# Patient Record
Sex: Female | Born: 1950
Health system: Southern US, Community
[De-identification: ages and names within clinical notes are randomized; demographics above are authoritative.]

## PROBLEM LIST (undated history)

## (undated) DIAGNOSIS — K449 Diaphragmatic hernia without obstruction or gangrene: Secondary | ICD-10-CM

## (undated) DIAGNOSIS — K2970 Gastritis, unspecified, without bleeding: Secondary | ICD-10-CM

## (undated) DIAGNOSIS — B9681 Helicobacter pylori [H. pylori] as the cause of diseases classified elsewhere: Secondary | ICD-10-CM

## (undated) DIAGNOSIS — M8580 Other specified disorders of bone density and structure, unspecified site: Secondary | ICD-10-CM

## (undated) DIAGNOSIS — B019 Varicella without complication: Secondary | ICD-10-CM

## (undated) DIAGNOSIS — K2270 Barrett's esophagus without dysplasia: Secondary | ICD-10-CM

## (undated) DIAGNOSIS — Z87442 Personal history of urinary calculi: Secondary | ICD-10-CM

## (undated) DIAGNOSIS — Z8669 Personal history of other diseases of the nervous system and sense organs: Secondary | ICD-10-CM

## (undated) DIAGNOSIS — M5416 Radiculopathy, lumbar region: Secondary | ICD-10-CM

## (undated) DIAGNOSIS — K219 Gastro-esophageal reflux disease without esophagitis: Secondary | ICD-10-CM

## (undated) DIAGNOSIS — F5101 Primary insomnia: Secondary | ICD-10-CM

## (undated) DIAGNOSIS — G43909 Migraine, unspecified, not intractable, without status migrainosus: Secondary | ICD-10-CM

## (undated) DIAGNOSIS — N3281 Overactive bladder: Secondary | ICD-10-CM

## (undated) DIAGNOSIS — N189 Chronic kidney disease, unspecified: Secondary | ICD-10-CM

## (undated) HISTORY — PX: APPENDECTOMY: SHX54

## (undated) HISTORY — PX: ABDOMINAL HYSTERECTOMY: SHX81

## (undated) HISTORY — PX: EYE SURGERY: SHX253

## (undated) HISTORY — PX: HERNIA REPAIR: SHX51

## (undated) HISTORY — PX: WISDOM TOOTH EXTRACTION: SHX21

## (undated) HISTORY — PX: TUBAL LIGATION: SHX77

## (undated) HISTORY — DX: Other specified disorders of bone density and structure, unspecified site: M85.80

## (undated) HISTORY — DX: Chronic kidney disease, unspecified: N18.9

## (undated) HISTORY — DX: Migraine, unspecified, not intractable, without status migrainosus: G43.909

## (undated) HISTORY — PX: COLONOSCOPY: SHX174

## (undated) HISTORY — PX: OTHER SURGICAL HISTORY: SHX169

## (undated) HISTORY — PX: TONSILLECTOMY: SUR1361

---

## 1993-11-16 HISTORY — PX: BILATERAL SALPINGOOPHORECTOMY: SHX1223

## 2004-07-13 ENCOUNTER — Ambulatory Visit: Payer: Self-pay | Admitting: General Surgery

## 2004-10-04 ENCOUNTER — Ambulatory Visit: Payer: Self-pay | Admitting: Unknown Physician Specialty

## 2004-12-30 ENCOUNTER — Ambulatory Visit: Payer: Self-pay | Admitting: Family Medicine

## 2005-02-20 ENCOUNTER — Ambulatory Visit: Payer: Self-pay | Admitting: Unknown Physician Specialty

## 2005-05-22 ENCOUNTER — Ambulatory Visit: Payer: Self-pay | Admitting: Gastroenterology

## 2005-10-05 ENCOUNTER — Ambulatory Visit: Payer: Self-pay | Admitting: Unknown Physician Specialty

## 2006-11-15 ENCOUNTER — Ambulatory Visit: Payer: Self-pay | Admitting: Unknown Physician Specialty

## 2006-11-16 ENCOUNTER — Ambulatory Visit: Payer: Self-pay | Admitting: Unknown Physician Specialty

## 2007-11-18 ENCOUNTER — Ambulatory Visit: Payer: Self-pay | Admitting: Unknown Physician Specialty

## 2008-01-23 ENCOUNTER — Ambulatory Visit: Payer: Self-pay | Admitting: Gastroenterology

## 2008-05-12 ENCOUNTER — Ambulatory Visit: Payer: Self-pay | Admitting: Unknown Physician Specialty

## 2008-11-24 ENCOUNTER — Ambulatory Visit: Payer: Self-pay

## 2009-09-01 ENCOUNTER — Ambulatory Visit: Payer: Self-pay | Admitting: Unknown Physician Specialty

## 2011-07-11 ENCOUNTER — Emergency Department: Payer: Self-pay | Admitting: *Deleted

## 2011-07-11 LAB — URINALYSIS, COMPLETE
Bacteria: NONE SEEN
Glucose,UR: NEGATIVE mg/dL (ref 0–75)
Ketone: NEGATIVE
Squamous Epithelial: <1
WBC UR: 2 /HPF (ref 0–5)

## 2011-07-11 LAB — CBC
HCT: 42.1 % (ref 35.0–47.0)
HGB: 13.5 g/dL (ref 12.0–16.0)
MCHC: 32.1 g/dL (ref 32.0–36.0)
MCV: 91 fL (ref 80–100)
RDW: 13.3 % (ref 11.5–14.5)
WBC: 10 10*3/uL (ref 3.6–11.0)

## 2011-07-11 LAB — COMPREHENSIVE METABOLIC PANEL
Albumin: 3.9 g/dL (ref 3.4–5.0)
Alkaline Phosphatase: 116 U/L (ref 50–136)
Anion Gap: 10 (ref 7–16)
BUN: 15 mg/dL (ref 7–18)
Bilirubin,Total: 0.3 mg/dL (ref 0.2–1.0)
Chloride: 105 mmol/L (ref 98–107)
Co2: 23 mmol/L (ref 21–32)
Creatinine: 0.88 mg/dL (ref 0.60–1.30)
EGFR (African American): >60
EGFR (Non-African Amer.): >60
Glucose: 116 mg/dL — ABNORMAL HIGH (ref 65–99)
Osmolality: 277 (ref 275–301)
SGPT (ALT): 27 U/L

## 2011-07-11 LAB — LIPASE, BLOOD: Lipase: 166 U/L (ref 73–393)

## 2012-03-05 ENCOUNTER — Ambulatory Visit: Payer: Self-pay | Admitting: Unknown Physician Specialty

## 2014-01-02 ENCOUNTER — Ambulatory Visit: Payer: Self-pay | Admitting: Gastroenterology

## 2014-01-14 ENCOUNTER — Ambulatory Visit: Payer: Self-pay | Admitting: Urgent Care

## 2014-01-14 LAB — BASIC METABOLIC PANEL
Anion Gap: 9 (ref 7–16)
BUN: 13 mg/dL (ref 7–18)
CALCIUM: 8.5 mg/dL (ref 8.5–10.1)
CHLORIDE: 106 mmol/L (ref 98–107)
CREATININE: 0.8 mg/dL (ref 0.60–1.30)
Co2: 25 mmol/L (ref 21–32)
EGFR (African American): >60
EGFR (Non-African Amer.): >60
Glucose: 133 mg/dL — ABNORMAL HIGH (ref 65–99)
Osmolality: 281 (ref 275–301)
POTASSIUM: 3.3 mmol/L — AB (ref 3.5–5.1)
Sodium: 140 mmol/L (ref 136–145)

## 2014-01-14 LAB — HEPATIC FUNCTION PANEL A (ARMC)
ALBUMIN: 3.3 g/dL — AB (ref 3.4–5.0)
ALK PHOS: 98 U/L
AST: 22 U/L (ref 15–37)
Bilirubin,Total: 0.4 mg/dL (ref 0.2–1.0)
SGPT (ALT): 24 U/L
Total Protein: 7.4 g/dL (ref 6.4–8.2)

## 2014-01-14 LAB — CBC WITH DIFFERENTIAL/PLATELET
Basophil #: 0 10*3/uL (ref 0.0–0.1)
Basophil %: 0.6 %
EOS ABS: 0.4 10*3/uL (ref 0.0–0.7)
EOS PCT: 4.7 %
HCT: 42.3 % (ref 35.0–47.0)
HGB: 14 g/dL (ref 12.0–16.0)
LYMPHS PCT: 24.9 %
Lymphocyte #: 2 10*3/uL (ref 1.0–3.6)
MCH: 29.8 pg (ref 26.0–34.0)
MCHC: 33.1 g/dL (ref 32.0–36.0)
MCV: 90 fL (ref 80–100)
MONO ABS: 0.9 x10 3/mm (ref 0.2–0.9)
Monocyte %: 11.3 %
NEUTROS PCT: 58.5 %
Neutrophil #: 4.7 10*3/uL (ref 1.4–6.5)
Platelet: 229 10*3/uL (ref 150–440)
RBC: 4.7 10*6/uL (ref 3.80–5.20)
RDW: 12.6 % (ref 11.5–14.5)
WBC: 8.1 10*3/uL (ref 3.6–11.0)

## 2014-01-14 LAB — LIPASE, BLOOD: Lipase: 123 U/L (ref 73–393)

## 2014-01-15 ENCOUNTER — Ambulatory Visit: Payer: Self-pay | Admitting: Urgent Care

## 2014-01-16 HISTORY — PX: CHOLECYSTECTOMY: SHX55

## 2014-01-23 ENCOUNTER — Ambulatory Visit: Payer: Self-pay | Admitting: Urgent Care

## 2014-05-14 ENCOUNTER — Ambulatory Visit
Admit: 2014-05-14 | Disposition: A | Discharge status: Home / Self Care | Payer: Self-pay | Attending: Surgery | Admitting: Surgery

## 2014-05-15 LAB — SURGICAL PATHOLOGY

## 2014-05-17 NOTE — Op Note (Addendum)
PATIENT NAME:  Nicole NeighborsWOODY, Shanina S MR#:  [id number] DATE OF BIRTH:  [birth year]  DATE OF PROCEDURE:  05/14/2014  PREOPERATIVE DIAGNOSIS: Biliary dyskinesia.   POSTOPERATIVE DIAGNOSIS:   Biliary dyskinesia.  OPERATION: Robot-assisted laparoscopic cholecystectomy.   SURGEON: Quentin Orealph L. Ely, MD  ANESTHESIA: General.   OPERATIVE PROCEDURE: With the patient in the supine position after the induction of appropriate general anesthesia, the patient's abdomen was prepped with ChloraPrep and draped with sterile towels. The patient was placed in the head down, feet up position. A small infraumbilical incision was made and extended above the umbilicus in an attempt to place the single site port. The fascia was identified, peritoneum was opened. However, there were multiple adhesions in the area.  The single site device was placed without difficulty but there were enough adhesions that I did not feel confident that I could visualize the gallbladder.  For that reason, we put 2 lateral ports in; robotic 8.5 mm ports, and then a 5 mm right upper quadrant port. We used the single site segment for the camera port only.  The patient was then placed in head up, feet down position, rotated slightly to the left side. Robot was brought to the table, docked to the instruments, instruments inserted under direct vision, directed to the bed of the gallbladder. I then moved to the console. The gallbladder was retracted superiorly and laterally, exposing  hepatoduodenal ligament. The cystic artery and cystic duct were identified. The common duct was identified. Dissection was carried along the hepatoduodenal ligament. The cystic duct was doubly clipped and divided. The cystic artery was doubly clipped and divided. The gallbladder was then dissected free of its bed and delivered using the hook  and  cautery apparatus. Once the gallbladder was free, the area was copiously suctioned and irrigated with normal saline. The robot was then  undocked, the instruments removed without difficulty, and the gallbladder was then removed through the single site incision. The area was examined there to be sure there were no evidence of any bowel injury. None was identified. The midline fascia was closed with figure-of-eight sutures of 0 Maxon. The skin was closed with 5-0 nylon. The area was infiltrated with 0.25% Marcaine for postoperative pain control. Sterile dressings were applied. The patient  returned to the recovery room, having tolerated the procedure well. Sponge, instrument and needle counts were correct x2 in the operating room.     ____________________________ Quentin Orealph L. Ely III, MD rle:tr D: 05/14/2014 09:22:26 ET T: 05/14/2014 13:50:52 ET JOB#: [id number]  cc: Carmie Endalph L. Ely III, MD, <Dictator> Rona Ravensobert W. Van Dalen, MD Quentin OreALPH L ELY MD ELECTRONICALLY SIGNED 06/01/2014 14:29

## 2014-07-09 ENCOUNTER — Ambulatory Visit (INDEPENDENT_AMBULATORY_CARE_PROVIDER_SITE_OTHER): Payer: Self-pay | Admitting: Surgery

## 2014-07-09 ENCOUNTER — Encounter: Payer: Self-pay | Admitting: Surgery

## 2014-07-09 VITALS — BP 135/67 | HR 67 | Temp 97.9°F | Resp 20 | Ht 61.0 in | Wt 135.0 lb

## 2014-07-09 DIAGNOSIS — K8020 Calculus of gallbladder without cholecystitis without obstruction: Secondary | ICD-10-CM

## 2014-07-09 NOTE — Progress Notes (Signed)
Outpatient Surgical Follow Up  07/09/2014  Nicole Velez is an 64 y.o. female.   Chief Complaint  Patient presents with  . Follow-up    HPI: She returns following her robotic laparoscopic cholecystectomy. She is doing well postoperatively. She has some mild fatigue issues and some intermittent back pain but has no significant problems overall. She is eating well with no dietary complaints. Her bowel function appears to be near normal.  Past Medical History  Diagnosis Date  . Chronic kidney disease   . Migraines     Past Surgical History  Procedure Laterality Date  . Abdominal hysterectomy    . Appendectomy    . Hernia repair    . Tonsillectomy      Family History  Problem Relation Age of Onset  . Hypertension Father     Social History:  reports that she has never smoked. She has never used smokeless tobacco. She reports that she drinks alcohol. She reports that she does not use illicit drugs.  Allergies: No Known Allergies  Medications reviewed.    ROS unchanged from hospitalization.    BP 135/67 mmHg  Pulse 67  Temp(Src) 97.9 F (36.6 C) (Oral)  Resp 20  Ht 5\' 1"  (1.549 m)  Wt 135 lb (61.236 kg)  BMI 25.52 kg/m2  Physical Exam her wounds look good. There is no evidence for infection. Sutures had been removed previously. Her lungs are clear and her abdomen otherwise benign.     No results found for this or any previous visit (from the past 48 hour(s)). No results found.  Assessment/Plan:  1. Calculus of gallbladder without cholecystitis without obstruction She is recovering uneventfully. I'm very pleased with her progress. We will plan to see her back in our office as necessary. We encouraged her to return to normal activity.     Tiney Rouge III  07/09/2014,negative

## 2014-09-07 ENCOUNTER — Telehealth: Payer: Self-pay | Admitting: Surgery

## 2014-09-07 NOTE — Telephone Encounter (Signed)
Feeling nausea when eating, not vomiting, feels like she can pass out at times

## 2014-09-07 NOTE — Telephone Encounter (Signed)
Returned patient call. Patient complaining of nausea and tiredness. Patient had improved after surgery on 05/14/14 and was tolerating diet well. Directed patient to go she her PCP first to determine the reason for her nausea. Patient confirmed understanding of directions and information.

## 2015-07-29 ENCOUNTER — Other Ambulatory Visit
Admission: RE | Admit: 2015-07-29 | Discharge: 2015-07-29 | Disposition: A | Discharge status: Home / Self Care | Payer: Self-pay | Source: Ambulatory Visit | Attending: Gastroenterology | Admitting: Gastroenterology

## 2015-07-29 DIAGNOSIS — R197 Diarrhea, unspecified: Secondary | ICD-10-CM | POA: Insufficient documentation

## 2015-07-29 LAB — GASTROINTESTINAL PANEL BY PCR, STOOL (REPLACES STOOL CULTURE)
Adenovirus F40/41: NOT DETECTED
Astrovirus: NOT DETECTED
CYCLOSPORA CAYETANENSIS: NOT DETECTED
Campylobacter species: NOT DETECTED
Cryptosporidium: NOT DETECTED
E. COLI O157: NOT DETECTED
ENTAMOEBA HISTOLYTICA: NOT DETECTED
Enteroaggregative E coli (EAEC): NOT DETECTED
Enteropathogenic E coli (EPEC): NOT DETECTED
Enterotoxigenic E coli (ETEC): NOT DETECTED
Giardia lamblia: NOT DETECTED
NOROVIRUS GI/GII: NOT DETECTED
Plesimonas shigelloides: NOT DETECTED
ROTAVIRUS A: NOT DETECTED
SALMONELLA SPECIES: NOT DETECTED
SAPOVIRUS (I, II, IV, AND V): NOT DETECTED
SHIGELLA/ENTEROINVASIVE E COLI (EIEC): NOT DETECTED
Shiga like toxin producing E coli (STEC): NOT DETECTED
VIBRIO CHOLERAE: NOT DETECTED
Vibrio species: NOT DETECTED
Yersinia enterocolitica: NOT DETECTED

## 2015-07-29 LAB — C DIFFICILE QUICK SCREEN W PCR REFLEX
C DIFFICILE (CDIFF) INTERP: NOT DETECTED
C DIFFICILE (CDIFF) TOXIN: NEGATIVE
C DIFFICLE (CDIFF) ANTIGEN: NEGATIVE

## 2015-08-17 ENCOUNTER — Encounter: Payer: Self-pay | Admitting: *Deleted

## 2015-08-18 ENCOUNTER — Ambulatory Visit: Payer: BLUE CROSS/BLUE SHIELD | Admitting: Anesthesiology

## 2015-08-18 ENCOUNTER — Encounter
Admission: RE | Disposition: A | Discharge status: Home / Self Care | Payer: Self-pay | Source: Ambulatory Visit | Attending: Gastroenterology

## 2015-08-18 ENCOUNTER — Ambulatory Visit
Admission: RE | Admit: 2015-08-18 | Discharge: 2015-08-18 | Disposition: A | Discharge status: Home / Self Care | Payer: BLUE CROSS/BLUE SHIELD | Source: Ambulatory Visit | Attending: Gastroenterology | Admitting: Gastroenterology

## 2015-08-18 DIAGNOSIS — R1013 Epigastric pain: Secondary | ICD-10-CM | POA: Diagnosis present

## 2015-08-18 DIAGNOSIS — K317 Polyp of stomach and duodenum: Secondary | ICD-10-CM | POA: Diagnosis not present

## 2015-08-18 DIAGNOSIS — G43909 Migraine, unspecified, not intractable, without status migrainosus: Secondary | ICD-10-CM | POA: Diagnosis not present

## 2015-08-18 DIAGNOSIS — G709 Myoneural disorder, unspecified: Secondary | ICD-10-CM | POA: Diagnosis not present

## 2015-08-18 DIAGNOSIS — K210 Gastro-esophageal reflux disease with esophagitis: Secondary | ICD-10-CM | POA: Diagnosis not present

## 2015-08-18 DIAGNOSIS — Z79899 Other long term (current) drug therapy: Secondary | ICD-10-CM | POA: Diagnosis not present

## 2015-08-18 DIAGNOSIS — K2950 Unspecified chronic gastritis without bleeding: Secondary | ICD-10-CM | POA: Diagnosis not present

## 2015-08-18 DIAGNOSIS — K449 Diaphragmatic hernia without obstruction or gangrene: Secondary | ICD-10-CM | POA: Insufficient documentation

## 2015-08-18 DIAGNOSIS — Z8619 Personal history of other infectious and parasitic diseases: Secondary | ICD-10-CM | POA: Insufficient documentation

## 2015-08-18 HISTORY — DX: Gastro-esophageal reflux disease without esophagitis: K21.9

## 2015-08-18 HISTORY — DX: Varicella without complication: B01.9

## 2015-08-18 HISTORY — DX: Helicobacter pylori (H. pylori) as the cause of diseases classified elsewhere: B96.81

## 2015-08-18 HISTORY — PX: ESOPHAGOGASTRODUODENOSCOPY (EGD) WITH PROPOFOL: SHX5813

## 2015-08-18 HISTORY — DX: Gastritis, unspecified, without bleeding: K29.70

## 2015-08-18 HISTORY — DX: Diaphragmatic hernia without obstruction or gangrene: K44.9

## 2015-08-18 SURGERY — ESOPHAGOGASTRODUODENOSCOPY (EGD) WITH PROPOFOL
Anesthesia: General

## 2015-08-18 MED ORDER — PROPOFOL 500 MG/50ML IV EMUL
INTRAVENOUS | Status: DC | PRN
  Administered 2015-08-18: 120 ug/kg/min via INTRAVENOUS

## 2015-08-18 MED ORDER — PROPOFOL 10 MG/ML IV BOLUS
INTRAVENOUS | Status: DC | PRN
  Administered 2015-08-18: 30 mg via INTRAVENOUS
  Administered 2015-08-18: 70 mg via INTRAVENOUS

## 2015-08-18 MED ORDER — LIDOCAINE 2% (20 MG/ML) 5 ML SYRINGE
INTRAMUSCULAR | Status: DC | PRN
  Administered 2015-08-18: 50 mg via INTRAVENOUS

## 2015-08-18 MED ORDER — GLYCOPYRROLATE 0.2 MG/ML IJ SOLN
INTRAMUSCULAR | Status: DC | PRN
  Administered 2015-08-18: 0.2 mg via INTRAVENOUS

## 2015-08-18 MED ORDER — SODIUM CHLORIDE 0.9 % IV SOLN
INTRAVENOUS | Status: DC
  Administered 2015-08-18: 1000 mL via INTRAVENOUS

## 2015-08-18 MED ORDER — MIDAZOLAM HCL 5 MG/5ML IJ SOLN
INTRAMUSCULAR | Status: DC | PRN
  Administered 2015-08-18: 1 mg via INTRAVENOUS

## 2015-08-18 MED ORDER — SODIUM CHLORIDE 0.9 % IV SOLN: INTRAVENOUS | Status: DC

## 2015-08-18 MED ORDER — LIDOCAINE HCL (PF) 1 % IJ SOLN
INTRAMUSCULAR | Status: AC
  Administered 2015-08-18: 0.5 mL
  Filled 2015-08-18: qty 2

## 2015-08-18 NOTE — H&P (Signed)
Outpatient short stay form Pre-procedure 08/18/2015 4:00 PM Nicole Deem MD  Primary Physician: Dr Marisue Ivan  Reason for visit:  EGD  History of present illness:  Patient is a 66 year old female presenting today with complaint of some epigastric pain and dyspepsia that occurs after eating. This has improved a little bit with a change of NSAID from Protonix to rabeprazole. She has no dysphagia. She has a remote history of Helicobacter pylori. She denies use of any aspirin products or blood thinning agents. She has had a history of the past of cholecystectomy and does have some loose stools since then.    Current Facility-Administered Medications:  .  0.9 %  sodium chloride infusion, , Intravenous, Continuous, Nicole Deem, MD, Last Rate: 20 mL/hr at 08/18/15 1513, 1,000 mL at 08/18/15 1513 .  0.9 %  sodium chloride infusion, , Intravenous, Continuous, Nicole Deem, MD  Facility-Administered Medications Ordered in Other Encounters:  .  glycopyrrolate (ROBINUL) injection, , Intravenous, Anesthesia Intra-op, Mathews Argyle, CRNA, 0.2 mg at 08/18/15 1553 .  midazolam (VERSED) 5 MG/5ML injection, , Intravenous, Anesthesia Intra-op, Mathews Argyle, CRNA, 1 mg at 08/18/15 1553  Prescriptions Prior to Admission  Medication Sig Dispense Refill Last Dose  . PREMARIN 0.625 MG tablet TK 1 T PO QD  3 08/17/2015 at Unknown time  . traMADol (ULTRAM) 50 MG tablet Take by mouth every 6 (six) hours as needed.     . traZODone (DESYREL) 50 MG tablet Take 50 mg by mouth at bedtime.   Past Week at Unknown time  . Calcium Carbonate-Vitamin D 600-400 MG-UNIT per tablet Take by mouth.   Taking  . Multiple Vitamin (MULTI-VITAMINS) TABS Take by mouth.   Taking     No Known Allergies   Past Medical History:  Diagnosis Date  . Chickenpox   . Chronic kidney disease   . GERD (gastroesophageal reflux disease)   . Helicobacter pylori gastritis   . Hiatal hernia   . Migraines   . Migraines      Review of systems:      Physical Exam    Heart and lungs: Regular rate and rhythm without rub or gallop, lungs are bilaterally clear.    HEENT: Normocephalic atraumatic eyes are anicteric    Other:     Pertinant exam for procedure: Soft, mild tenderness to palpation the epigastric region, nondistended bowel sounds positive normoactive. No masses rebound or organomegaly.    Planned proceedures: EGD and indicated procedures. I have discussed the risks benefits and complications of procedures to include not limited to bleeding, infection, perforation and the risk of sedation and the patient wishes to proceed.    Nicole Deem, MD Gastroenterology 08/18/2015  4:00 PM

## 2015-08-18 NOTE — Transfer of Care (Signed)
Immediate Anesthesia Transfer of Care Note  Patient: Nicole Velez  Procedure(s) Performed: Procedure(s): ESOPHAGOGASTRODUODENOSCOPY (EGD) WITH PROPOFOL (N/A)  Patient Location: Endoscopy Unit  Anesthesia Type:General  Level of Consciousness: awake  Airway & Oxygen Therapy: Patient Spontanous Breathing and Patient connected to nasal cannula oxygen  Post-op Assessment: Report given to RN and Post -op Vital signs reviewed and stable  Post vital signs: Reviewed  Last Vitals:  Vitals:   08/18/15 1437 08/18/15 1624  BP: 132/63 (!) 118/55  Pulse: 67 87  Resp: 20 20  Temp: 36.9 C     Last Pain:  Vitals:   08/18/15 1437  TempSrc: Tympanic         Complications: No apparent anesthesia complications

## 2015-08-18 NOTE — Op Note (Signed)
[facility] Gastroenterology Patient Name: Nicole Velez Procedure Date: 08/18/2015 3:53 PM MRN: [id number] Account #: [id number] Date of Birth: March 20, 1950 Admit Type: Outpatient Age: 65 Room: [facility] ENDO ROOM 1 Gender: Female Note Status: Finalized Procedure:            Upper GI endoscopy Indications:          Epigastric abdominal pain, Dyspepsia Providers:            Christena Deem, MD Referring MD:         No Local Md, MD (Referring MD) Medicines:            Monitored Anesthesia Care Complications:        No immediate complications. Procedure:            Pre-Anesthesia Assessment:                       - ASA Grade Assessment: II - A patient with mild                        systemic disease.                       After obtaining informed consent, the endoscope was                        passed under direct vision. Throughout the procedure,                        the patient's blood pressure, pulse, and oxygen                        saturations were monitored continuously. The Endoscope                        was introduced through the mouth, and advanced to the                        third part of duodenum. The upper GI endoscopy was                        accomplished without difficulty. The patient tolerated                        the procedure well. Findings:      The Z-line was variable. Biopsies were taken with a cold forceps for       histology.      The exam of the esophagus was otherwise normal.      Diffuse mild inflammation characterized by congestion (edema) and       erythema was found in the gastric body. Biopsies were taken with a cold       forceps for histology.      A small hiatal hernia was found. The Z-line was a variable distance from       incisors; the hiatal hernia was sliding.      A single 3 mm sessile polyp with no bleeding and no stigmata of recent       bleeding was found in the gastric body. Biopsies were taken with a cold   forceps for histology.      Mild mucosal variance characterized by altered texture was found in  the       entire duodenum. Biopsies were taken with a cold forceps for histology. Impression:           - Z-line variable. Biopsied.                       - Gastritis. Biopsied.                       - Small hiatal hernia.                       - A single gastric polyp. Biopsied.                       - Mucosal variant in the duodenum. Biopsied. Recommendation:       - Continue present medications.                       - Use sucralfate tablets 1 gram PO QID.                       - Return to GI clinic in 1 month. Procedure Code(s):    --- Professional ---                       [phone], Esophagogastroduodenoscopy, flexible, transoral;                        with biopsy, single or multiple Diagnosis Code(s):    --- Professional ---                       K22.8, Other specified diseases of esophagus                       K29.70, Gastritis, unspecified, without bleeding                       K44.9, Diaphragmatic hernia without obstruction or                        gangrene                       K31.7, Polyp of stomach and duodenum                       K31.89, Other diseases of stomach and duodenum                       R10.13, Epigastric pain CPT copyright 2016 [employer] Association. All rights reserved. The codes documented in this report are preliminary and upon coder review may  be revised to meet current compliance requirements. Christena Deem, MD 08/18/2015 4:25:33 PM This report has been signed electronically. Number of Addenda: 0 Note Initiated On: 08/18/2015 3:53 PM      [facility]

## 2015-08-18 NOTE — Anesthesia Postprocedure Evaluation (Signed)
Anesthesia Post Note  Patient: Nicole Velez  Procedure(s) Performed: Procedure(s) (LRB): ESOPHAGOGASTRODUODENOSCOPY (EGD) WITH PROPOFOL (N/A)  Patient location during evaluation: Endoscopy Anesthesia Type: General Level of consciousness: awake and alert Pain management: pain level controlled Vital Signs Assessment: post-procedure vital signs reviewed and stable Respiratory status: spontaneous breathing, nonlabored ventilation, respiratory function stable and patient connected to nasal cannula oxygen Cardiovascular status: blood pressure returned to baseline and stable Postop Assessment: no signs of nausea or vomiting Anesthetic complications: no    Last Vitals:  Vitals:   08/18/15 1644 08/18/15 1700  BP: 136/74   Pulse: 68 64  Resp: 20 17  Temp:      Last Pain:  Vitals:   08/18/15 1624  TempSrc: Tympanic                 Cleda Mccreedy Piscitello

## 2015-08-18 NOTE — Anesthesia Preprocedure Evaluation (Addendum)
Anesthesia Evaluation  Patient identified by MRN, date of birth, ID band Patient awake    Reviewed: Allergy & Precautions, NPO status , Patient's Chart, lab work & pertinent test results  History of Anesthesia Complications Negative for: history of anesthetic complications  Airway Mallampati: III       Dental  (+) Poor Dentition   Pulmonary neg pulmonary ROS,    breath sounds clear to auscultation       Cardiovascular Exercise Tolerance: Good  Rhythm:Regular     Neuro/Psych  Headaches,  Neuromuscular disease    GI/Hepatic hiatal hernia, GERD  Medicated,  Endo/Other    Renal/GU Renal disease     Musculoskeletal   Abdominal Normal abdominal exam  (+)   Peds negative pediatric ROS (+)  Hematology negative hematology ROS (+)   Anesthesia Other Findings Past Medical History: No date: Chickenpox No date: Chronic kidney disease No date: GERD (gastroesophageal reflux disease) No date: Helicobacter pylori gastritis No date: Hiatal hernia No date: Migraines No date: Migraines   Reproductive/Obstetrics                            Anesthesia Physical Anesthesia Plan  ASA: III  Anesthesia Plan: General   Post-op Pain Management:    Induction: Intravenous  Airway Management Planned: Natural Airway and Nasal Cannula  Additional Equipment:   Intra-op Plan:   Post-operative Plan:   Informed Consent: I have reviewed the patients History and Physical, chart, labs and discussed the procedure including the risks, benefits and alternatives for the proposed anesthesia with the patient or authorized representative who has indicated his/her understanding and acceptance.     Plan Discussed with: CRNA  Anesthesia Plan Comments:        Anesthesia Quick Evaluation

## 2015-08-19 ENCOUNTER — Encounter: Payer: Self-pay | Admitting: Gastroenterology

## 2015-08-20 LAB — SURGICAL PATHOLOGY

## 2015-09-14 ENCOUNTER — Other Ambulatory Visit: Payer: Self-pay | Admitting: Obstetrics & Gynecology

## 2015-09-14 DIAGNOSIS — Z1231 Encounter for screening mammogram for malignant neoplasm of breast: Secondary | ICD-10-CM

## 2015-09-29 ENCOUNTER — Ambulatory Visit
Admission: RE | Admit: 2015-09-29 | Discharge: 2015-09-29 | Disposition: A | Discharge status: Home / Self Care | Payer: BLUE CROSS/BLUE SHIELD | Source: Ambulatory Visit | Attending: Obstetrics & Gynecology | Admitting: Obstetrics & Gynecology

## 2015-09-29 DIAGNOSIS — Z1231 Encounter for screening mammogram for malignant neoplasm of breast: Secondary | ICD-10-CM | POA: Diagnosis not present

## 2015-10-04 ENCOUNTER — Other Ambulatory Visit: Payer: Self-pay | Admitting: Orthopedic Surgery

## 2015-10-04 DIAGNOSIS — M5441 Lumbago with sciatica, right side: Secondary | ICD-10-CM

## 2015-10-04 DIAGNOSIS — M5417 Radiculopathy, lumbosacral region: Secondary | ICD-10-CM

## 2016-03-21 ENCOUNTER — Other Ambulatory Visit: Payer: Self-pay | Admitting: Gastroenterology

## 2016-03-21 DIAGNOSIS — R1013 Epigastric pain: Secondary | ICD-10-CM

## 2016-03-29 ENCOUNTER — Ambulatory Visit
Admission: RE | Admit: 2016-03-29 | Discharge: 2016-03-29 | Disposition: A | Discharge status: Home / Self Care | Payer: BLUE CROSS/BLUE SHIELD | Source: Ambulatory Visit | Attending: Gastroenterology | Admitting: Gastroenterology

## 2016-03-29 DIAGNOSIS — R1013 Epigastric pain: Secondary | ICD-10-CM | POA: Insufficient documentation

## 2016-03-29 DIAGNOSIS — K210 Gastro-esophageal reflux disease with esophagitis: Secondary | ICD-10-CM | POA: Diagnosis not present

## 2016-03-29 MED ORDER — TECHNETIUM TC 99M SULFUR COLLOID
2.0000 | Freq: Once | INTRAVENOUS | Status: AC | PRN
  Administered 2016-03-29: 2.76 via INTRAVENOUS

## 2016-07-20 ENCOUNTER — Other Ambulatory Visit: Payer: Self-pay | Admitting: Obstetrics & Gynecology

## 2016-07-20 DIAGNOSIS — Z1231 Encounter for screening mammogram for malignant neoplasm of breast: Secondary | ICD-10-CM

## 2016-10-02 ENCOUNTER — Ambulatory Visit: Payer: BLUE CROSS/BLUE SHIELD

## 2016-10-26 ENCOUNTER — Ambulatory Visit
Admission: RE | Admit: 2016-10-26 | Discharge: 2016-10-26 | Disposition: A | Discharge status: Home / Self Care | Payer: BLUE CROSS/BLUE SHIELD | Source: Ambulatory Visit | Attending: Obstetrics & Gynecology | Admitting: Obstetrics & Gynecology

## 2016-10-26 DIAGNOSIS — Z1231 Encounter for screening mammogram for malignant neoplasm of breast: Secondary | ICD-10-CM | POA: Insufficient documentation

## 2017-06-26 ENCOUNTER — Other Ambulatory Visit: Payer: Self-pay | Admitting: Gastroenterology

## 2017-06-26 DIAGNOSIS — R1084 Generalized abdominal pain: Secondary | ICD-10-CM

## 2017-06-28 ENCOUNTER — Ambulatory Visit
Admission: RE | Admit: 2017-06-28 | Discharge: 2017-06-28 | Disposition: A | Discharge status: Home / Self Care | Payer: Medicare Other | Source: Ambulatory Visit | Attending: Gastroenterology | Admitting: Gastroenterology

## 2017-06-28 DIAGNOSIS — N200 Calculus of kidney: Secondary | ICD-10-CM | POA: Diagnosis not present

## 2017-06-28 DIAGNOSIS — R1084 Generalized abdominal pain: Secondary | ICD-10-CM | POA: Diagnosis present

## 2017-06-28 DIAGNOSIS — N281 Cyst of kidney, acquired: Secondary | ICD-10-CM | POA: Diagnosis not present

## 2017-06-28 DIAGNOSIS — Z9049 Acquired absence of other specified parts of digestive tract: Secondary | ICD-10-CM | POA: Diagnosis not present

## 2017-06-28 DIAGNOSIS — Z9889 Other specified postprocedural states: Secondary | ICD-10-CM | POA: Insufficient documentation

## 2017-06-28 DIAGNOSIS — Z90710 Acquired absence of both cervix and uterus: Secondary | ICD-10-CM | POA: Diagnosis not present

## 2017-06-28 MED ORDER — IOPAMIDOL (ISOVUE-300) INJECTION 61%
85.0000 mL | Freq: Once | INTRAVENOUS | Status: AC | PRN
  Administered 2017-06-28: 85 mL via INTRAVENOUS

## 2017-07-24 ENCOUNTER — Other Ambulatory Visit: Payer: Self-pay | Admitting: Obstetrics & Gynecology

## 2017-07-24 DIAGNOSIS — Z1231 Encounter for screening mammogram for malignant neoplasm of breast: Secondary | ICD-10-CM

## 2017-09-25 ENCOUNTER — Emergency Department
Admission: EM | Admit: 2017-09-25 | Discharge: 2017-09-25 | Disposition: A | Discharge status: Home / Self Care | Payer: Medicare Other | Attending: Emergency Medicine | Admitting: Emergency Medicine

## 2017-09-25 ENCOUNTER — Emergency Department: Payer: Medicare Other

## 2017-09-25 ENCOUNTER — Encounter: Payer: Self-pay | Admitting: Emergency Medicine

## 2017-09-25 ENCOUNTER — Other Ambulatory Visit: Payer: Self-pay

## 2017-09-25 DIAGNOSIS — Z79899 Other long term (current) drug therapy: Secondary | ICD-10-CM | POA: Diagnosis not present

## 2017-09-25 DIAGNOSIS — N200 Calculus of kidney: Secondary | ICD-10-CM | POA: Diagnosis not present

## 2017-09-25 DIAGNOSIS — N189 Chronic kidney disease, unspecified: Secondary | ICD-10-CM | POA: Diagnosis not present

## 2017-09-25 DIAGNOSIS — R1110 Vomiting, unspecified: Secondary | ICD-10-CM | POA: Insufficient documentation

## 2017-09-25 DIAGNOSIS — R1032 Left lower quadrant pain: Secondary | ICD-10-CM | POA: Diagnosis present

## 2017-09-25 LAB — URINALYSIS, COMPLETE (UACMP) WITH MICROSCOPIC
Bacteria, UA: NONE SEEN
Bilirubin Urine: NEGATIVE
GLUCOSE, UA: NEGATIVE mg/dL
Ketones, ur: 20 mg/dL — AB
LEUKOCYTES UA: NEGATIVE
Nitrite: NEGATIVE
Protein, ur: 30 mg/dL — AB
SPECIFIC GRAVITY, URINE: 1.019 (ref 1.005–1.030)
pH: 6 (ref 5.0–8.0)

## 2017-09-25 LAB — BASIC METABOLIC PANEL
Anion gap: 12 (ref 5–15)
BUN: 16 mg/dL (ref 8–23)
CHLORIDE: 103 mmol/L (ref 98–111)
CO2: 21 mmol/L — AB (ref 22–32)
Calcium: 9.2 mg/dL (ref 8.9–10.3)
Creatinine, Ser: 0.83 mg/dL (ref 0.44–1.00)
GFR calc Af Amer: >60 mL/min (ref >60)
GFR calc non Af Amer: >60 mL/min (ref >60)
GLUCOSE: 132 mg/dL — AB (ref 70–99)
POTASSIUM: 3.8 mmol/L (ref 3.5–5.1)
Sodium: 136 mmol/L (ref 135–145)

## 2017-09-25 LAB — CBC
HCT: 42.3 % (ref 35.0–47.0)
Hemoglobin: 14.6 g/dL (ref 12.0–16.0)
MCH: 31.1 pg (ref 26.0–34.0)
MCHC: 34.6 g/dL (ref 32.0–36.0)
MCV: 89.9 fL (ref 80.0–100.0)
Platelets: 231 10*3/uL (ref 150–440)
RBC: 4.7 MIL/uL (ref 3.80–5.20)
RDW: 13.9 % (ref 11.5–14.5)
WBC: 9.6 10*3/uL (ref 3.6–11.0)

## 2017-09-25 LAB — GLUCOSE, CAPILLARY: Glucose-Capillary: 135 mg/dL — ABNORMAL HIGH (ref 70–99)

## 2017-09-25 MED ORDER — NAPROXEN 500 MG PO TABS: 500.0000 mg | ORAL_TABLET | Freq: Two times a day (BID) | ORAL | 2 refills | Status: DC

## 2017-09-25 MED ORDER — ONDANSETRON 4 MG PO TBDP: 4.0000 mg | ORAL_TABLET | Freq: Three times a day (TID) | ORAL | 0 refills | Status: DC | PRN

## 2017-09-25 MED ORDER — FENTANYL CITRATE (PF) 100 MCG/2ML IJ SOLN: 50.0000 ug | INTRAMUSCULAR | Status: DC | PRN

## 2017-09-25 MED ORDER — TAMSULOSIN HCL 0.4 MG PO CAPS: 0.4000 mg | ORAL_CAPSULE | Freq: Every day | ORAL | 0 refills | Status: DC

## 2017-09-25 MED ORDER — MORPHINE SULFATE (PF) 4 MG/ML IV SOLN
4.0000 mg | Freq: Once | INTRAVENOUS | Status: AC
  Administered 2017-09-25: 4 mg via INTRAVENOUS
  Filled 2017-09-25: qty 1

## 2017-09-25 MED ORDER — ONDANSETRON HCL 4 MG/2ML IJ SOLN
4.0000 mg | Freq: Once | INTRAMUSCULAR | Status: AC | PRN
  Administered 2017-09-25: 4 mg via INTRAVENOUS
  Filled 2017-09-25: qty 2

## 2017-09-25 MED ORDER — KETOROLAC TROMETHAMINE 30 MG/ML IJ SOLN
15.0000 mg | Freq: Once | INTRAMUSCULAR | Status: AC
  Administered 2017-09-25: 15 mg via INTRAVENOUS
  Filled 2017-09-25: qty 1

## 2017-09-25 MED ORDER — HYDROCODONE-ACETAMINOPHEN 5-325 MG PO TABS: 1.0000 | ORAL_TABLET | ORAL | 0 refills | Status: DC | PRN

## 2017-09-25 NOTE — ED Provider Notes (Signed)
[facility] [address] Emergency Department Provider Note   ____________________________________________    I have reviewed the triage vital signs and the nursing notes.   HISTORY  Chief Complaint Flank Pain and Emesis     HPI Nicole Velez is a 67 y.o. female who presents with complaints of left-sided flank pain and nausea and vomiting.  Patient reports the pain started this morning, is severe and radiates into her left groin.  She states this feels like kidney stones which she has had in the past.  She denies fevers or chills.  No dysuria.  Does report some difficulty urinating.  She has not taken anything for this.   Past Medical History:  Diagnosis Date  . Chickenpox   . Chronic kidney disease   . GERD (gastroesophageal reflux disease)   . Helicobacter pylori gastritis   . Hiatal hernia   . Migraines   . Osteopenia     There are no active problems to display for this patient.   Past Surgical History:  Procedure Laterality Date  . ABDOMINAL HYSTERECTOMY    . APPENDECTOMY    . BILATERAL SALPINGOOPHORECTOMY  11/1993   [facility]  . CHOLECYSTECTOMY  2016  . COLONOSCOPY    . ESOPHAGOGASTRODUODENOSCOPY (EGD) WITH PROPOFOL N/A 08/18/2015   Procedure: ESOPHAGOGASTRODUODENOSCOPY (EGD) WITH PROPOFOL;  Surgeon: Christena Deem, MD;  Location: [facility] ENDOSCOPY;  Service: Endoscopy;  Laterality: N/A;  . HERNIA REPAIR    . TONSILLECTOMY      Prior to Admission medications   Medication Sig Start Date End Date Taking? Authorizing Provider  Multiple Vitamin (MULTI-VITAMINS) TABS Take 1 tablet by mouth daily.    Yes [provider]  MYRBETRIQ 25 MG TB24 tablet Take 1 tablet by mouth daily. 08/17/17  Yes [provider]  PREMARIN 0.625 MG tablet Take 0.625 mg by mouth daily.  04/26/14  Yes [provider]  RABEprazole (ACIPHEX) 20 MG tablet Take 1 tablet by mouth daily. 09/10/17  Yes [provider]  HYDROcodone-acetaminophen  (NORCO/VICODIN) 5-325 MG tablet Take 1 tablet by mouth every 4 (four) hours as needed for moderate pain. 09/25/17   Jene Every, MD  naproxen (NAPROSYN) 500 MG tablet Take 1 tablet (500 mg total) by mouth 2 (two) times daily with a meal. 09/25/17   Jene Every, MD  ondansetron (ZOFRAN ODT) 4 MG disintegrating tablet Take 1 tablet (4 mg total) by mouth every 8 (eight) hours as needed for nausea or vomiting. 09/25/17   Jene Every, MD  tamsulosin (FLOMAX) 0.4 MG CAPS capsule Take 1 capsule (0.4 mg total) by mouth daily. 09/25/17   Jene Every, MD     Allergies Patient has no known allergies.  Family History  Problem Relation Age of Onset  . Hypertension Father     Social History Social History   Tobacco Use  . Smoking status: Never Smoker  . Smokeless tobacco: Never Used  Substance Use Topics  . Alcohol use: Yes    Alcohol/week: 0.0 standard drinks    Comment: occasional  . Drug use: No    Review of Systems  Constitutional: No fever/chills Eyes: No visual changes.  ENT: No sore throat. Cardiovascular: Denies chest pain. Respiratory: Denies shortness of breath. Gastrointestinal: As above Genitourinary: Negative for dysuria.  As above Musculoskeletal: Negative for back pain. Skin: Negative for rash. Neurological: Negative for headaches or weakness   ____________________________________________   PHYSICAL EXAM:  VITAL SIGNS: ED Triage Vitals  Enc Vitals Group     BP  09/25/17 1050 (!) 162/78     Pulse Rate 09/25/17 1050 71     Resp 09/25/17 1050 20     Temp 09/25/17 1050 (!) 97.5 F (36.4 C)     Temp Source 09/25/17 1050 Oral     SpO2 09/25/17 1050 100 %     Weight 09/25/17 1050 65.3 kg (144 lb)     Height 09/25/17 1050 1.575 m (5\' 2" )     Head Circumference --      Peak Flow --      Pain Score 09/25/17 1057 10     Pain Loc --      Pain Edu? --      Excl. in GC? --     Constitutional: Alert and oriented.  In obvious discomfort  Nose: No  congestion/rhinnorhea. Mouth/Throat: Mucous membranes are moist.   Neck:  Painless ROM Cardiovascular: Normal rate, regular rhythm. Grossly normal heart sounds.  Good peripheral circulation. Respiratory: Normal respiratory effort.  No retractions.  Gastrointestinal: Soft and nontender. No distention.  No CVA tenderness. Genitourinary: deferred Musculoskeletal:Warm and well perfused Neurologic:  Normal speech and language. No gross focal neurologic deficits are appreciated.  Skin:  Skin is warm, dry and intact. No rash noted. Psychiatric: Mood and affect are normal. Speech and behavior are normal.  ____________________________________________   LABS (all labs ordered are listed, but only abnormal results are displayed)  Labs Reviewed  URINALYSIS, COMPLETE (UACMP) WITH MICROSCOPIC - Abnormal; Notable for the following components:      Result Value   Color, Urine YELLOW (*)    APPearance HAZY (*)    Hgb urine dipstick MODERATE (*)    Ketones, ur 20 (*)    Protein, ur 30 (*)    All other components within normal limits  BASIC METABOLIC PANEL - Abnormal; Notable for the following components:   CO2 21 (*)    Glucose, Bld 132 (*)    All other components within normal limits  GLUCOSE, CAPILLARY - Abnormal; Notable for the following components:   Glucose-Capillary 135 (*)    All other components within normal limits  CBC   ____________________________________________  EKG   ____________________________________________  RADIOLOGY  CT scan confirms 3 mm ureterolithiasis ____________________________________________   PROCEDURES  Procedure(s) performed: No  Procedures   Critical Care performed: No ____________________________________________   INITIAL IMPRESSION / ASSESSMENT AND PLAN / ED COURSE  Pertinent labs & imaging results that were available during my care of the patient were reviewed by me and considered in my medical decision making (see chart for  details).  She presents with abrupt onset of left-sided flank pain, quite suspicious for ureterolithiasis.  Will treat with IV Toradol, IV Zofran, IV fluids, obtain labs, urinalysis and CT renal stone study  Little improvement with Toradol we will give IV morphine.  CT confirms kidney stone.  Patient's pain is much better . Urinalysis negative for infection.  Patient's pain remains controlled, appropriate for discharge with outpatient follow-up.  Return precautions discussed    ____________________________________________   FINAL CLINICAL IMPRESSION(S) / ED DIAGNOSES  Final diagnoses:  Kidney stone        Note:  This document was prepared using Dragon voice recognition software and may include unintentional dictation errors.     Jene Every, MD 09/25/17 [phone]

## 2017-09-25 NOTE — ED Notes (Signed)
Pt gone to CT 

## 2017-09-25 NOTE — ED Notes (Addendum)
Pt had brief period of confusion after IV stick, pt's daughter states she does that around getting needles over the past few days.  Will hold on patient receiving Fentanyl until after she has calmed down.

## 2017-09-25 NOTE — ED Notes (Addendum)
Esign not working at this time pt verbalized discharge instructions and has no questions at this time.

## 2017-09-25 NOTE — ED Triage Notes (Signed)
Pt arrived via POV with reports of left flank pain and vomiting that started this morning. Pt has hx of kidney stones, pain feels similar.    Pt had episode of vomiting in triage.

## 2017-11-16 ENCOUNTER — Ambulatory Visit
Admission: RE | Admit: 2017-11-16 | Discharge: 2017-11-16 | Disposition: A | Discharge status: Home / Self Care | Payer: Medicare Other | Source: Ambulatory Visit | Attending: Obstetrics & Gynecology | Admitting: Obstetrics & Gynecology

## 2017-11-16 DIAGNOSIS — Z1231 Encounter for screening mammogram for malignant neoplasm of breast: Secondary | ICD-10-CM

## 2018-05-13 ENCOUNTER — Ambulatory Visit: Admission: RE | Admit: 2018-05-13 | Payer: Medicare Other | Source: Home / Self Care | Admitting: Gastroenterology

## 2018-05-13 ENCOUNTER — Encounter: Admission: RE | Payer: Self-pay | Source: Home / Self Care

## 2018-05-13 SURGERY — ESOPHAGOGASTRODUODENOSCOPY (EGD) WITH PROPOFOL
Anesthesia: General

## 2018-08-28 DIAGNOSIS — M545 Low back pain: Secondary | ICD-10-CM | POA: Diagnosis not present

## 2018-08-28 DIAGNOSIS — M533 Sacrococcygeal disorders, not elsewhere classified: Secondary | ICD-10-CM | POA: Diagnosis not present

## 2018-08-29 ENCOUNTER — Other Ambulatory Visit: Payer: Self-pay | Admitting: Family Medicine

## 2018-08-29 DIAGNOSIS — M545 Low back pain, unspecified: Secondary | ICD-10-CM

## 2018-09-10 ENCOUNTER — Ambulatory Visit: Admission: RE | Admit: 2018-09-10 | Payer: Medicare HMO | Source: Ambulatory Visit

## 2018-09-24 ENCOUNTER — Other Ambulatory Visit: Payer: Self-pay

## 2018-09-24 ENCOUNTER — Ambulatory Visit
Admission: RE | Admit: 2018-09-24 | Discharge: 2018-09-24 | Disposition: A | Discharge status: Home / Self Care | Payer: Medicare HMO | Source: Ambulatory Visit | Attending: Family Medicine | Admitting: Family Medicine

## 2018-09-24 ENCOUNTER — Other Ambulatory Visit: Payer: Self-pay | Admitting: Obstetrics & Gynecology

## 2018-09-24 DIAGNOSIS — Z124 Encounter for screening for malignant neoplasm of cervix: Secondary | ICD-10-CM | POA: Diagnosis not present

## 2018-09-24 DIAGNOSIS — Z1231 Encounter for screening mammogram for malignant neoplasm of breast: Secondary | ICD-10-CM

## 2018-09-24 DIAGNOSIS — M545 Low back pain, unspecified: Secondary | ICD-10-CM

## 2018-09-24 DIAGNOSIS — Z1211 Encounter for screening for malignant neoplasm of colon: Secondary | ICD-10-CM | POA: Diagnosis not present

## 2018-09-27 DIAGNOSIS — M25552 Pain in left hip: Secondary | ICD-10-CM | POA: Diagnosis not present

## 2018-09-27 DIAGNOSIS — M5126 Other intervertebral disc displacement, lumbar region: Secondary | ICD-10-CM | POA: Diagnosis not present

## 2018-09-27 DIAGNOSIS — G5702 Lesion of sciatic nerve, left lower limb: Secondary | ICD-10-CM | POA: Diagnosis not present

## 2018-09-27 DIAGNOSIS — R531 Weakness: Secondary | ICD-10-CM | POA: Diagnosis not present

## 2018-09-27 DIAGNOSIS — M5432 Sciatica, left side: Secondary | ICD-10-CM | POA: Diagnosis not present

## 2018-10-03 DIAGNOSIS — R531 Weakness: Secondary | ICD-10-CM | POA: Diagnosis not present

## 2018-10-03 DIAGNOSIS — M5432 Sciatica, left side: Secondary | ICD-10-CM | POA: Diagnosis not present

## 2018-10-03 DIAGNOSIS — M25552 Pain in left hip: Secondary | ICD-10-CM | POA: Diagnosis not present

## 2018-10-03 DIAGNOSIS — G5702 Lesion of sciatic nerve, left lower limb: Secondary | ICD-10-CM | POA: Diagnosis not present

## 2018-10-11 DIAGNOSIS — M25552 Pain in left hip: Secondary | ICD-10-CM | POA: Diagnosis not present

## 2018-10-18 DIAGNOSIS — M5416 Radiculopathy, lumbar region: Secondary | ICD-10-CM | POA: Diagnosis not present

## 2018-10-18 DIAGNOSIS — M62830 Muscle spasm of back: Secondary | ICD-10-CM | POA: Diagnosis not present

## 2018-10-18 DIAGNOSIS — M5136 Other intervertebral disc degeneration, lumbar region: Secondary | ICD-10-CM | POA: Diagnosis not present

## 2018-10-24 DIAGNOSIS — M25552 Pain in left hip: Secondary | ICD-10-CM | POA: Diagnosis not present

## 2018-11-06 DIAGNOSIS — M5136 Other intervertebral disc degeneration, lumbar region: Secondary | ICD-10-CM | POA: Diagnosis not present

## 2018-11-06 DIAGNOSIS — M62830 Muscle spasm of back: Secondary | ICD-10-CM | POA: Diagnosis not present

## 2018-11-06 DIAGNOSIS — M5416 Radiculopathy, lumbar region: Secondary | ICD-10-CM | POA: Diagnosis not present

## 2018-11-08 DIAGNOSIS — G5702 Lesion of sciatic nerve, left lower limb: Secondary | ICD-10-CM | POA: Diagnosis not present

## 2018-11-08 DIAGNOSIS — M5432 Sciatica, left side: Secondary | ICD-10-CM | POA: Diagnosis not present

## 2018-11-08 DIAGNOSIS — M25552 Pain in left hip: Secondary | ICD-10-CM | POA: Diagnosis not present

## 2018-11-08 DIAGNOSIS — R531 Weakness: Secondary | ICD-10-CM | POA: Diagnosis not present

## 2018-11-18 ENCOUNTER — Ambulatory Visit
Admission: RE | Admit: 2018-11-18 | Discharge: 2018-11-18 | Disposition: A | Discharge status: Home / Self Care | Payer: Medicare HMO | Source: Ambulatory Visit | Attending: Obstetrics & Gynecology | Admitting: Obstetrics & Gynecology

## 2018-11-18 DIAGNOSIS — Z1231 Encounter for screening mammogram for malignant neoplasm of breast: Secondary | ICD-10-CM | POA: Insufficient documentation

## 2018-11-21 DIAGNOSIS — M25552 Pain in left hip: Secondary | ICD-10-CM | POA: Diagnosis not present

## 2018-11-29 DIAGNOSIS — M25552 Pain in left hip: Secondary | ICD-10-CM | POA: Diagnosis not present

## 2018-11-29 DIAGNOSIS — M5432 Sciatica, left side: Secondary | ICD-10-CM | POA: Diagnosis not present

## 2018-11-29 DIAGNOSIS — G5702 Lesion of sciatic nerve, left lower limb: Secondary | ICD-10-CM | POA: Diagnosis not present

## 2018-11-29 DIAGNOSIS — R531 Weakness: Secondary | ICD-10-CM | POA: Diagnosis not present

## 2018-12-02 DIAGNOSIS — M5416 Radiculopathy, lumbar region: Secondary | ICD-10-CM | POA: Diagnosis not present

## 2018-12-02 DIAGNOSIS — M5136 Other intervertebral disc degeneration, lumbar region: Secondary | ICD-10-CM | POA: Diagnosis not present

## 2018-12-02 DIAGNOSIS — M62830 Muscle spasm of back: Secondary | ICD-10-CM | POA: Diagnosis not present

## 2018-12-06 DIAGNOSIS — M25552 Pain in left hip: Secondary | ICD-10-CM | POA: Diagnosis not present

## 2018-12-06 DIAGNOSIS — G5702 Lesion of sciatic nerve, left lower limb: Secondary | ICD-10-CM | POA: Diagnosis not present

## 2018-12-06 DIAGNOSIS — R531 Weakness: Secondary | ICD-10-CM | POA: Diagnosis not present

## 2018-12-06 DIAGNOSIS — M5432 Sciatica, left side: Secondary | ICD-10-CM | POA: Diagnosis not present

## 2018-12-31 DIAGNOSIS — M5416 Radiculopathy, lumbar region: Secondary | ICD-10-CM | POA: Diagnosis not present

## 2018-12-31 DIAGNOSIS — M5136 Other intervertebral disc degeneration, lumbar region: Secondary | ICD-10-CM | POA: Diagnosis not present

## 2019-01-28 DIAGNOSIS — M62830 Muscle spasm of back: Secondary | ICD-10-CM | POA: Diagnosis not present

## 2019-01-28 DIAGNOSIS — M5416 Radiculopathy, lumbar region: Secondary | ICD-10-CM | POA: Diagnosis not present

## 2019-01-28 DIAGNOSIS — M5136 Other intervertebral disc degeneration, lumbar region: Secondary | ICD-10-CM | POA: Diagnosis not present

## 2019-02-04 DIAGNOSIS — N3281 Overactive bladder: Secondary | ICD-10-CM | POA: Diagnosis not present

## 2019-02-04 DIAGNOSIS — N200 Calculus of kidney: Secondary | ICD-10-CM | POA: Diagnosis not present

## 2019-02-10 DIAGNOSIS — H1045 Other chronic allergic conjunctivitis: Secondary | ICD-10-CM | POA: Diagnosis not present

## 2019-02-10 DIAGNOSIS — H524 Presbyopia: Secondary | ICD-10-CM | POA: Diagnosis not present

## 2019-02-10 DIAGNOSIS — H35013 Changes in retinal vascular appearance, bilateral: Secondary | ICD-10-CM | POA: Diagnosis not present

## 2019-02-10 DIAGNOSIS — H35362 Drusen (degenerative) of macula, left eye: Secondary | ICD-10-CM | POA: Diagnosis not present

## 2019-02-10 DIAGNOSIS — H52223 Regular astigmatism, bilateral: Secondary | ICD-10-CM | POA: Diagnosis not present

## 2019-02-10 DIAGNOSIS — H2513 Age-related nuclear cataract, bilateral: Secondary | ICD-10-CM | POA: Diagnosis not present

## 2019-02-10 DIAGNOSIS — H04123 Dry eye syndrome of bilateral lacrimal glands: Secondary | ICD-10-CM | POA: Diagnosis not present

## 2019-02-10 DIAGNOSIS — H5203 Hypermetropia, bilateral: Secondary | ICD-10-CM | POA: Diagnosis not present

## 2019-03-27 DIAGNOSIS — F4321 Adjustment disorder with depressed mood: Secondary | ICD-10-CM | POA: Diagnosis not present

## 2019-03-27 DIAGNOSIS — Z0000 Encounter for general adult medical examination without abnormal findings: Secondary | ICD-10-CM | POA: Diagnosis not present

## 2019-03-28 DIAGNOSIS — Z136 Encounter for screening for cardiovascular disorders: Secondary | ICD-10-CM | POA: Diagnosis not present

## 2019-03-28 DIAGNOSIS — Z13220 Encounter for screening for lipoid disorders: Secondary | ICD-10-CM | POA: Diagnosis not present

## 2019-03-28 DIAGNOSIS — Z131 Encounter for screening for diabetes mellitus: Secondary | ICD-10-CM | POA: Diagnosis not present

## 2019-05-06 DIAGNOSIS — J069 Acute upper respiratory infection, unspecified: Secondary | ICD-10-CM | POA: Diagnosis not present

## 2019-05-26 DIAGNOSIS — Z03818 Encounter for observation for suspected exposure to other biological agents ruled out: Secondary | ICD-10-CM | POA: Diagnosis not present

## 2019-07-28 DIAGNOSIS — M5136 Other intervertebral disc degeneration, lumbar region: Secondary | ICD-10-CM | POA: Diagnosis not present

## 2019-07-28 DIAGNOSIS — M5416 Radiculopathy, lumbar region: Secondary | ICD-10-CM | POA: Diagnosis not present

## 2019-07-30 DIAGNOSIS — Z136 Encounter for screening for cardiovascular disorders: Secondary | ICD-10-CM | POA: Diagnosis not present

## 2019-07-30 DIAGNOSIS — Z1331 Encounter for screening for depression: Secondary | ICD-10-CM | POA: Diagnosis not present

## 2019-07-30 DIAGNOSIS — M549 Dorsalgia, unspecified: Secondary | ICD-10-CM | POA: Diagnosis not present

## 2019-07-30 DIAGNOSIS — K219 Gastro-esophageal reflux disease without esophagitis: Secondary | ICD-10-CM | POA: Diagnosis not present

## 2019-07-30 DIAGNOSIS — F5101 Primary insomnia: Secondary | ICD-10-CM | POA: Diagnosis not present

## 2019-09-10 IMAGING — CT CT RENAL STONE PROTOCOL
2 of 4 series · 16 of 46 positions shown, 18 images · non-contrast
Comparison: CT abdomen and pelvis 06/28/2017 in 07/11/2011.

CLINICAL DATA: Left flank pain, nausea and vomiting beginning this
morning.

EXAM:
CT ABDOMEN AND PELVIS WITHOUT CONTRAST
TECHNIQUE: Multidetector CT imaging of the abdomen and pelvis was performed
following the standard protocol without IV contrast.

[Series 2: stone full standard · axial · 0.59mm/px · z∈[-1218,-798]mm · 13 of 92 slices shown, 15 images]
[im 4/92  soft-tissue]
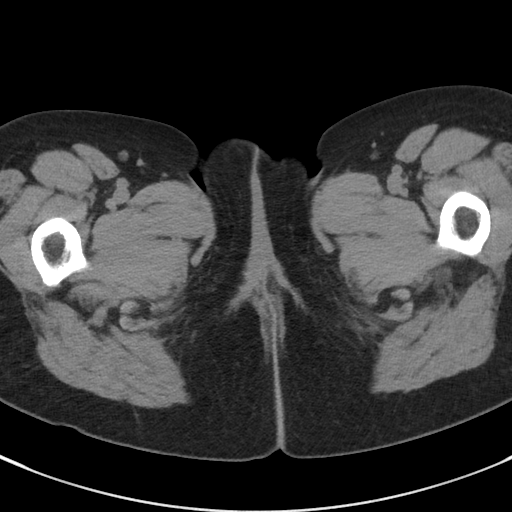
[im 4/92  bone]
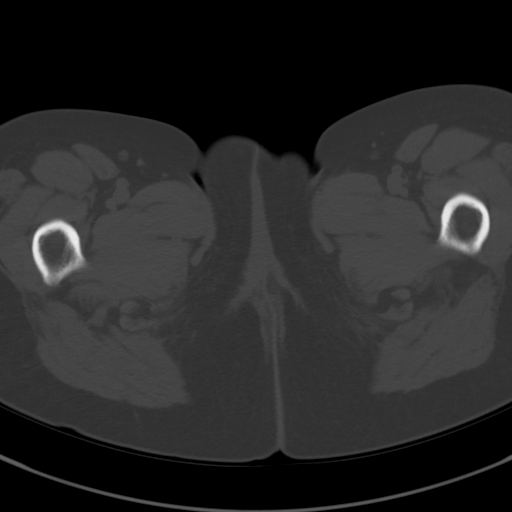
[im 12/92  soft-tissue]
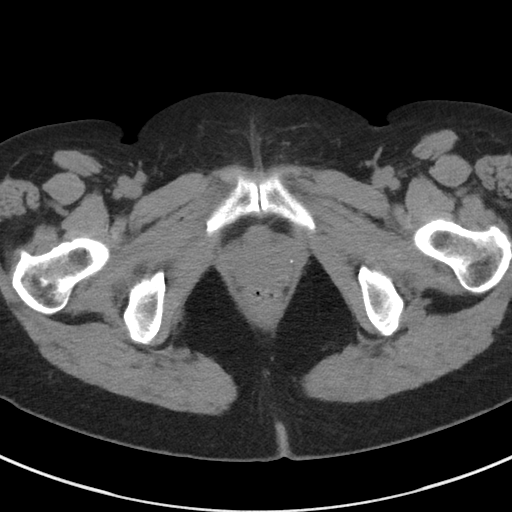
[im 20/92  soft-tissue]
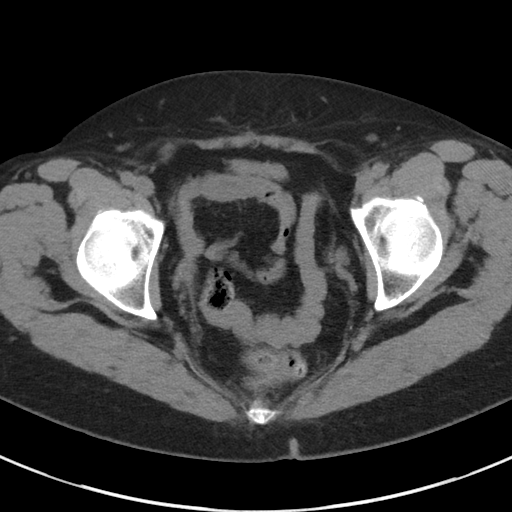
[im 24/92  soft-tissue]
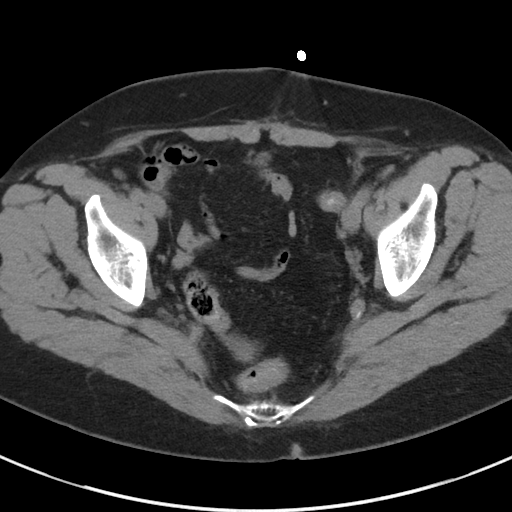
[im 32/92  soft-tissue]
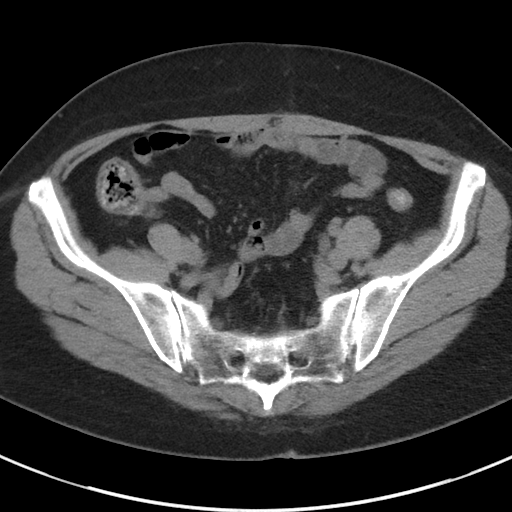
[im 40/92  soft-tissue]
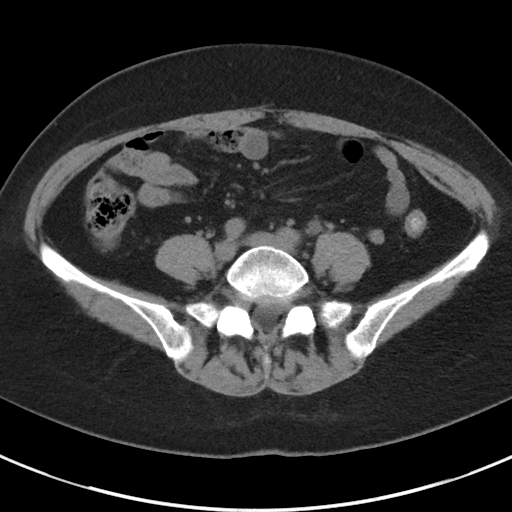
[im 48/92  soft-tissue]
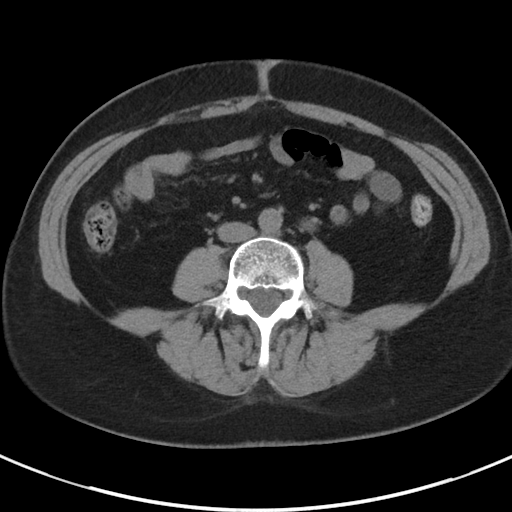
[im 52/92  soft-tissue]
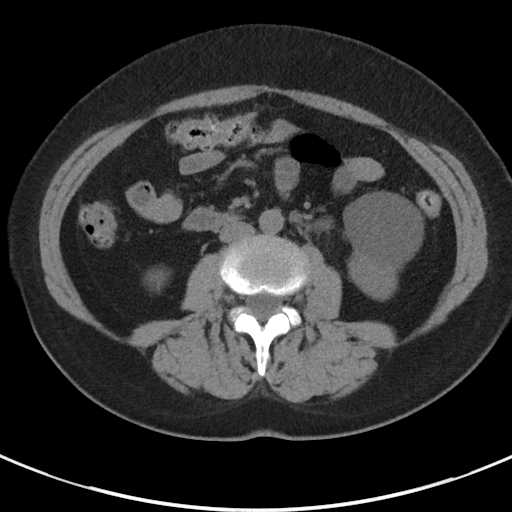
[im 60/92  soft-tissue]
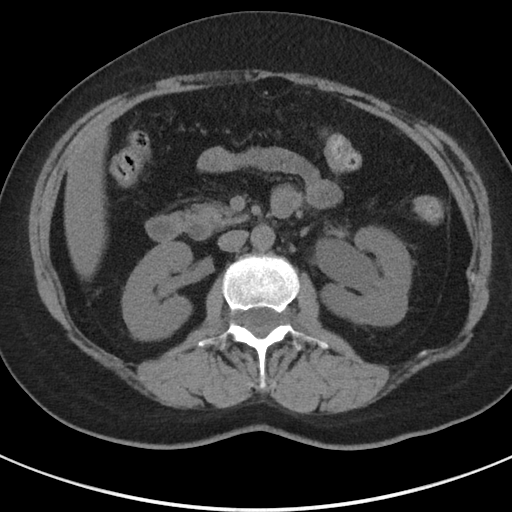
[im 60/92  bone]
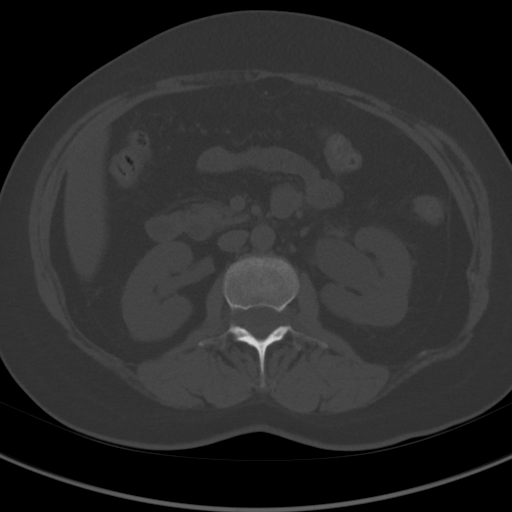
[im 68/92  soft-tissue]
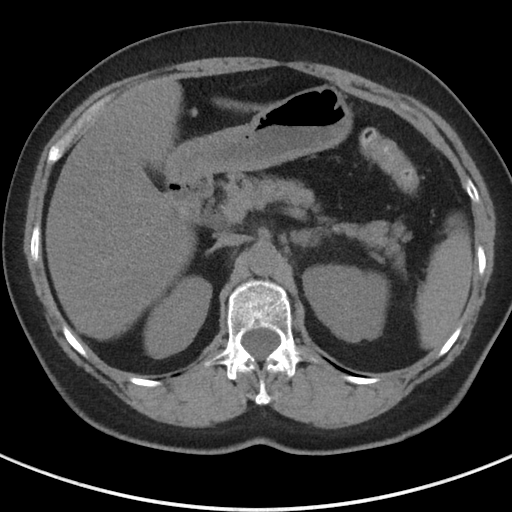
[im 72/92  soft-tissue]
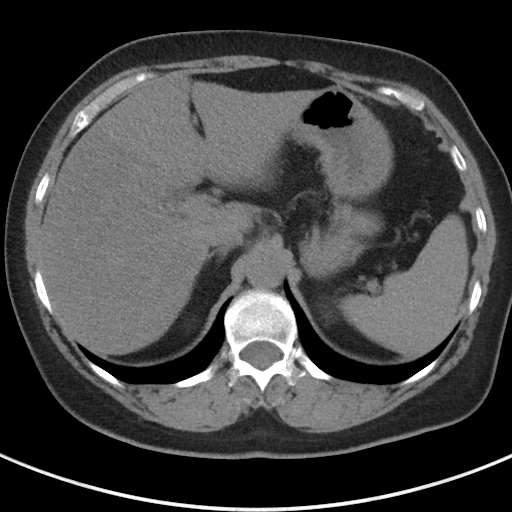
[im 80/92  soft-tissue]
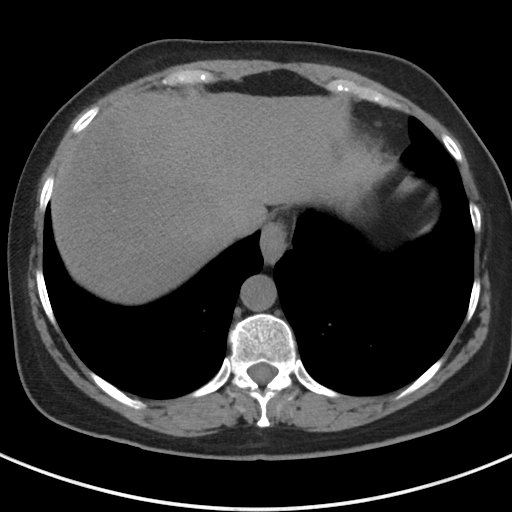
[im 88/92  soft-tissue]
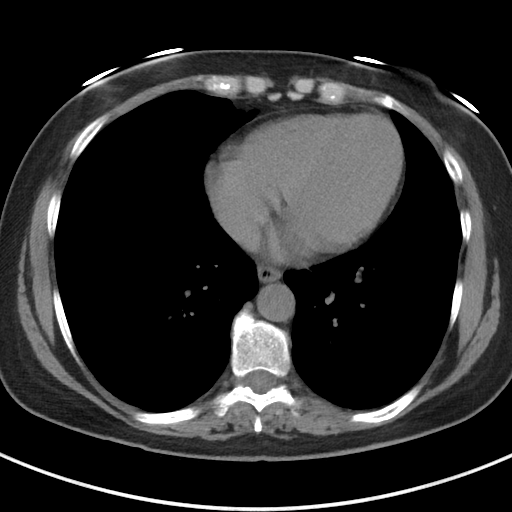

[Series 5: coronal · coronal · 0.70mm/px · 3 of 119 slices shown]
[im 40/119  soft-tissue]
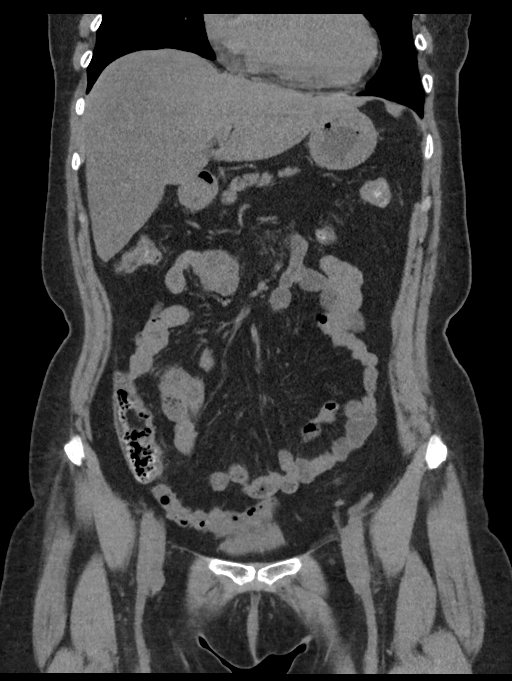
[im 53/119  soft-tissue]
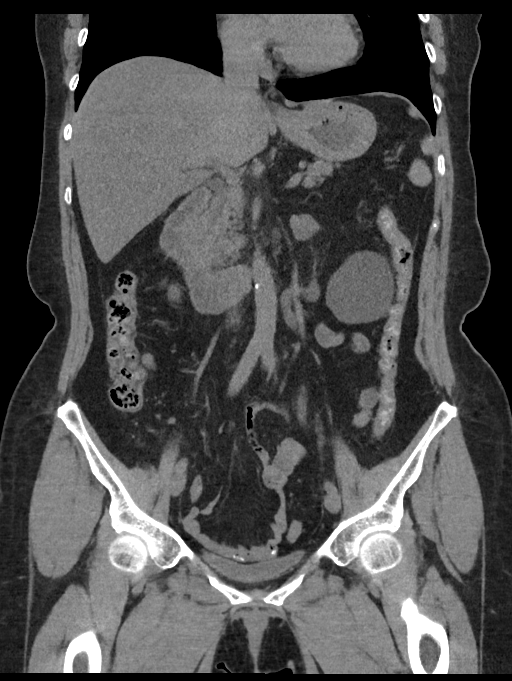
[im 66/119  soft-tissue]
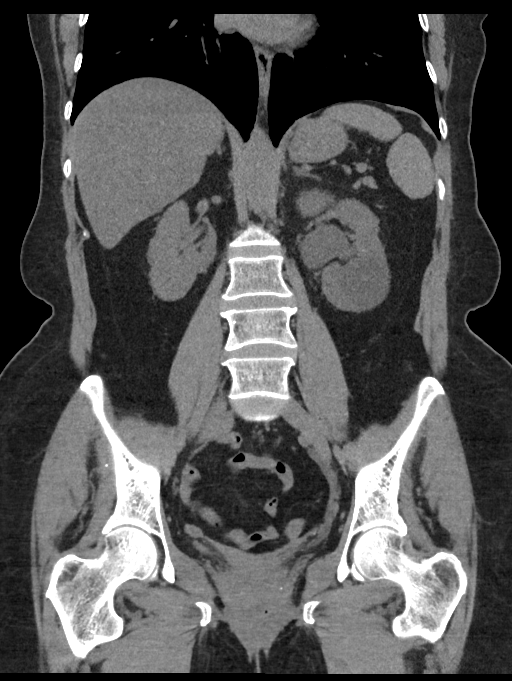

[16 of 46 positions shown; findings below may reference images not displayed]

FINDINGS: Lower chest: 0.4 cm right lower lobe pulmonary nodule is unchanged
on image 8. Lung bases otherwise clear. No pleural or pericardial
effusion.

Hepatobiliary: The liver is diffusely low attenuating consistent
with fatty infiltration. No focal lesion. The patient is status post
cholecystectomy. Biliary tree appears normal.

Pancreas: Unremarkable. No pancreatic ductal dilatation or
surrounding inflammatory changes.

Spleen: Normal in size without focal abnormality.

Adrenals/Urinary Tract: The patient has new mild to moderate left
hydronephrosis and dilatation of the left ureter due to a 0.3 cm
distal left ureteral stone. The stone was in the lower pole of the
left kidney on the prior CT. Cyst in the lower pole left kidney is
noted. Right kidney appears normal. The urinary bladder is
decompressed but otherwise unremarkable. The adrenal glands appear
normal.

Stomach/Bowel: The stomach and small and large bowel appear normal.
The appendix has been removed.

Vascular/Lymphatic: A few scattered aortic atherosclerotic
calcifications are identified. No aneurysm. No lymphadenopathy.

Reproductive: Status post hysterectomy. No adnexal masses.

Other: None.

Musculoskeletal: Bone island in the right ilium is unchanged.
IMPRESSION: Mild to moderate left hydronephrosis due to a 0.3 cm distal left
ureteral stone. No other acute abnormality. No other urinary tract
stones.

Fatty infiltration of the liver.

## 2019-10-07 ENCOUNTER — Other Ambulatory Visit: Payer: Self-pay | Admitting: Obstetrics & Gynecology

## 2019-10-07 DIAGNOSIS — Z01419 Encounter for gynecological examination (general) (routine) without abnormal findings: Secondary | ICD-10-CM | POA: Diagnosis not present

## 2019-10-07 DIAGNOSIS — Z1151 Encounter for screening for human papillomavirus (HPV): Secondary | ICD-10-CM | POA: Diagnosis not present

## 2019-10-07 DIAGNOSIS — Z1239 Encounter for other screening for malignant neoplasm of breast: Secondary | ICD-10-CM | POA: Diagnosis not present

## 2019-10-07 DIAGNOSIS — Z1231 Encounter for screening mammogram for malignant neoplasm of breast: Secondary | ICD-10-CM | POA: Diagnosis not present

## 2019-10-07 DIAGNOSIS — Z1211 Encounter for screening for malignant neoplasm of colon: Secondary | ICD-10-CM | POA: Diagnosis not present

## 2019-10-07 DIAGNOSIS — Z1331 Encounter for screening for depression: Secondary | ICD-10-CM | POA: Diagnosis not present

## 2019-11-19 ENCOUNTER — Other Ambulatory Visit: Payer: Self-pay

## 2019-11-19 ENCOUNTER — Ambulatory Visit
Admission: RE | Admit: 2019-11-19 | Discharge: 2019-11-19 | Disposition: A | Discharge status: Home / Self Care | Payer: Medicare HMO | Source: Ambulatory Visit | Attending: Obstetrics & Gynecology | Admitting: Obstetrics & Gynecology

## 2019-11-19 DIAGNOSIS — Z1231 Encounter for screening mammogram for malignant neoplasm of breast: Secondary | ICD-10-CM | POA: Diagnosis not present

## 2020-01-09 DIAGNOSIS — B9689 Other specified bacterial agents as the cause of diseases classified elsewhere: Secondary | ICD-10-CM | POA: Diagnosis not present

## 2020-01-09 DIAGNOSIS — R0789 Other chest pain: Secondary | ICD-10-CM | POA: Diagnosis not present

## 2020-01-09 DIAGNOSIS — J209 Acute bronchitis, unspecified: Secondary | ICD-10-CM | POA: Diagnosis not present

## 2020-01-09 DIAGNOSIS — J019 Acute sinusitis, unspecified: Secondary | ICD-10-CM | POA: Diagnosis not present

## 2020-01-09 DIAGNOSIS — U071 COVID-19: Secondary | ICD-10-CM | POA: Diagnosis not present

## 2020-01-09 DIAGNOSIS — Z03818 Encounter for observation for suspected exposure to other biological agents ruled out: Secondary | ICD-10-CM | POA: Diagnosis not present

## 2021-01-31 ENCOUNTER — Other Ambulatory Visit: Payer: Self-pay | Admitting: Family Medicine

## 2021-01-31 DIAGNOSIS — G8929 Other chronic pain: Secondary | ICD-10-CM

## 2021-02-15 ENCOUNTER — Ambulatory Visit
Admission: RE | Admit: 2021-02-15 | Discharge: 2021-02-15 | Disposition: A | Discharge status: Home / Self Care | Payer: Medicare Other | Source: Ambulatory Visit | Attending: Family Medicine | Admitting: Family Medicine

## 2021-02-15 ENCOUNTER — Other Ambulatory Visit: Payer: Self-pay

## 2021-02-15 DIAGNOSIS — R109 Unspecified abdominal pain: Secondary | ICD-10-CM | POA: Diagnosis present

## 2021-02-15 DIAGNOSIS — G8929 Other chronic pain: Secondary | ICD-10-CM | POA: Diagnosis present

## 2021-02-15 MED ORDER — IOHEXOL 300 MG/ML  SOLN
100.0000 mL | Freq: Once | INTRAMUSCULAR | Status: AC | PRN
  Administered 2021-02-15: 100 mL via INTRAVENOUS

## 2021-05-26 ENCOUNTER — Other Ambulatory Visit: Payer: Self-pay | Admitting: Family Medicine

## 2021-05-26 ENCOUNTER — Other Ambulatory Visit: Payer: Self-pay | Admitting: Obstetrics and Gynecology

## 2021-05-26 DIAGNOSIS — Z1231 Encounter for screening mammogram for malignant neoplasm of breast: Secondary | ICD-10-CM

## 2021-06-27 ENCOUNTER — Ambulatory Visit
Admission: RE | Admit: 2021-06-27 | Discharge: 2021-06-27 | Disposition: A | Discharge status: Home / Self Care | Payer: Medicare Other | Source: Ambulatory Visit | Attending: Obstetrics and Gynecology | Admitting: Obstetrics and Gynecology

## 2021-06-27 DIAGNOSIS — Z1231 Encounter for screening mammogram for malignant neoplasm of breast: Secondary | ICD-10-CM | POA: Diagnosis present

## 2021-12-05 ENCOUNTER — Emergency Department: Payer: Medicare Other | Admitting: Anesthesiology

## 2021-12-05 ENCOUNTER — Other Ambulatory Visit: Payer: Self-pay

## 2021-12-05 ENCOUNTER — Ambulatory Visit
Admission: EM | Admit: 2021-12-05 | Discharge: 2021-12-05 | Disposition: A | Discharge status: Home / Self Care | Payer: Medicare Other | Attending: Emergency Medicine | Admitting: Emergency Medicine

## 2021-12-05 ENCOUNTER — Encounter: Payer: Self-pay | Admitting: Emergency Medicine

## 2021-12-05 ENCOUNTER — Emergency Department: Payer: Medicare Other

## 2021-12-05 ENCOUNTER — Encounter
Admission: EM | Disposition: A | Discharge status: Home / Self Care | Payer: Self-pay | Source: Home / Self Care | Attending: Emergency Medicine

## 2021-12-05 DIAGNOSIS — K219 Gastro-esophageal reflux disease without esophagitis: Secondary | ICD-10-CM | POA: Insufficient documentation

## 2021-12-05 DIAGNOSIS — Z23 Encounter for immunization: Secondary | ICD-10-CM | POA: Insufficient documentation

## 2021-12-05 DIAGNOSIS — L03011 Cellulitis of right finger: Secondary | ICD-10-CM | POA: Insufficient documentation

## 2021-12-05 DIAGNOSIS — L089 Local infection of the skin and subcutaneous tissue, unspecified: Secondary | ICD-10-CM

## 2021-12-05 DIAGNOSIS — N189 Chronic kidney disease, unspecified: Secondary | ICD-10-CM | POA: Insufficient documentation

## 2021-12-05 DIAGNOSIS — W458XXA Other foreign body or object entering through skin, initial encounter: Secondary | ICD-10-CM | POA: Diagnosis not present

## 2021-12-05 DIAGNOSIS — M795 Residual foreign body in soft tissue: Secondary | ICD-10-CM

## 2021-12-05 DIAGNOSIS — S61240A Puncture wound with foreign body of right index finger without damage to nail, initial encounter: Secondary | ICD-10-CM | POA: Insufficient documentation

## 2021-12-05 HISTORY — PX: INCISION AND DRAINAGE OF WOUND: SHX1803

## 2021-12-05 LAB — CBC WITH DIFFERENTIAL/PLATELET
Abs Immature Granulocytes: 0.02 10*3/uL (ref 0.00–0.07)
Basophils Absolute: 0.1 10*3/uL (ref 0.0–0.1)
Basophils Relative: 1 %
Eosinophils Absolute: 0.2 10*3/uL (ref 0.0–0.5)
Eosinophils Relative: 2 %
HCT: 40.9 % (ref 36.0–46.0)
Hemoglobin: 13.8 g/dL (ref 12.0–15.0)
Immature Granulocytes: 0 %
Lymphocytes Relative: 21 %
Lymphs Abs: 2 10*3/uL (ref 0.7–4.0)
MCH: 29.6 pg (ref 26.0–34.0)
MCHC: 33.7 g/dL (ref 30.0–36.0)
MCV: 87.8 fL (ref 80.0–100.0)
Monocytes Absolute: 0.7 10*3/uL (ref 0.1–1.0)
Monocytes Relative: 7 %
Neutro Abs: 6.5 10*3/uL (ref 1.7–7.7)
Neutrophils Relative %: 69 %
Platelets: 218 10*3/uL (ref 150–400)
RBC: 4.66 MIL/uL (ref 3.87–5.11)
RDW: 13.3 % (ref 11.5–15.5)
WBC: 9.3 10*3/uL (ref 4.0–10.5)
nRBC: 0 % (ref 0.0–0.2)

## 2021-12-05 LAB — BASIC METABOLIC PANEL
Anion gap: 8 (ref 5–15)
BUN: 16 mg/dL (ref 8–23)
CO2: 22 mmol/L (ref 22–32)
Calcium: 8.7 mg/dL — ABNORMAL LOW (ref 8.9–10.3)
Chloride: 109 mmol/L (ref 98–111)
Creatinine, Ser: 0.62 mg/dL (ref 0.44–1.00)
GFR, Estimated: >60 mL/min (ref >60)
Glucose, Bld: 101 mg/dL — ABNORMAL HIGH (ref 70–99)
Potassium: 3.7 mmol/L (ref 3.5–5.1)
Sodium: 139 mmol/L (ref 135–145)

## 2021-12-05 SURGERY — IRRIGATION AND DEBRIDEMENT WOUND
Anesthesia: General | Laterality: Right

## 2021-12-05 MED ORDER — DEXAMETHASONE SODIUM PHOSPHATE 10 MG/ML IJ SOLN
INTRAMUSCULAR | Status: AC
  Filled 2021-12-05: qty 1

## 2021-12-05 MED ORDER — CEFAZOLIN SODIUM-DEXTROSE 2-4 GM/100ML-% IV SOLN: 2.0000 g | INTRAVENOUS | Status: DC

## 2021-12-05 MED ORDER — OXYCODONE HCL 5 MG PO TABS: 5.0000 mg | ORAL_TABLET | Freq: Once | ORAL | Status: DC | PRN

## 2021-12-05 MED ORDER — HYDROCODONE-ACETAMINOPHEN 5-325 MG PO TABS: 1.0000 | ORAL_TABLET | ORAL | Status: DC | PRN

## 2021-12-05 MED ORDER — CEFAZOLIN SODIUM-DEXTROSE 2-4 GM/100ML-% IV SOLN
INTRAVENOUS | Status: AC
  Filled 2021-12-05: qty 100

## 2021-12-05 MED ORDER — LACTATED RINGERS IV SOLN: INTRAVENOUS | Status: DC

## 2021-12-05 MED ORDER — METOCLOPRAMIDE HCL 5 MG/ML IJ SOLN: 5.0000 mg | Freq: Three times a day (TID) | INTRAMUSCULAR | Status: DC | PRN

## 2021-12-05 MED ORDER — 0.9 % SODIUM CHLORIDE (POUR BTL) OPTIME
TOPICAL | Status: DC | PRN
  Administered 2021-12-05: 100 mL

## 2021-12-05 MED ORDER — CEPHALEXIN 500 MG PO CAPS: 500.0000 mg | ORAL_CAPSULE | Freq: Four times a day (QID) | ORAL | 0 refills | Status: DC

## 2021-12-05 MED ORDER — LIDOCAINE HCL (CARDIAC) PF 100 MG/5ML IV SOSY
PREFILLED_SYRINGE | INTRAVENOUS | Status: DC | PRN
  Administered 2021-12-05: 80 mg via INTRAVENOUS

## 2021-12-05 MED ORDER — LIDOCAINE HCL (PF) 2 % IJ SOLN
INTRAMUSCULAR | Status: AC
  Filled 2021-12-05: qty 5

## 2021-12-05 MED ORDER — ACETAMINOPHEN 10 MG/ML IV SOLN: 1000.0000 mg | Freq: Once | INTRAVENOUS | Status: DC | PRN

## 2021-12-05 MED ORDER — DEXAMETHASONE SODIUM PHOSPHATE 10 MG/ML IJ SOLN
INTRAMUSCULAR | Status: DC | PRN
  Administered 2021-12-05: 10 mg via INTRAVENOUS

## 2021-12-05 MED ORDER — LACTATED RINGERS IV SOLN: INTRAVENOUS | Status: DC | PRN

## 2021-12-05 MED ORDER — PROPOFOL 10 MG/ML IV BOLUS
INTRAVENOUS | Status: AC
  Filled 2021-12-05: qty 20

## 2021-12-05 MED ORDER — ONDANSETRON HCL 4 MG/2ML IJ SOLN
INTRAMUSCULAR | Status: AC
  Filled 2021-12-05: qty 2

## 2021-12-05 MED ORDER — HYDROCODONE-ACETAMINOPHEN 5-325 MG PO TABS: 1.0000 | ORAL_TABLET | Freq: Four times a day (QID) | ORAL | 0 refills | Status: DC | PRN

## 2021-12-05 MED ORDER — FENTANYL CITRATE (PF) 100 MCG/2ML IJ SOLN
INTRAMUSCULAR | Status: AC
  Filled 2021-12-05: qty 2

## 2021-12-05 MED ORDER — OXYCODONE HCL 5 MG/5ML PO SOLN: 5.0000 mg | Freq: Once | ORAL | Status: DC | PRN

## 2021-12-05 MED ORDER — SODIUM CHLORIDE 0.9 % IV SOLN: INTRAVENOUS | Status: DC

## 2021-12-05 MED ORDER — ONDANSETRON HCL 4 MG/2ML IJ SOLN
INTRAMUSCULAR | Status: DC | PRN
  Administered 2021-12-05: 4 mg via INTRAVENOUS

## 2021-12-05 MED ORDER — LIDOCAINE-EPINEPHRINE-TETRACAINE (LET) TOPICAL GEL
3.0000 mL | Freq: Once | TOPICAL | Status: AC
  Administered 2021-12-05: 3 mL via TOPICAL
  Filled 2021-12-05: qty 3

## 2021-12-05 MED ORDER — PENTAFLUOROPROP-TETRAFLUOROETH EX AERO
INHALATION_SPRAY | CUTANEOUS | Status: AC
  Filled 2021-12-05: qty 30

## 2021-12-05 MED ORDER — BUPIVACAINE HCL (PF) 0.5 % IJ SOLN
INTRAMUSCULAR | Status: AC
  Filled 2021-12-05: qty 30

## 2021-12-05 MED ORDER — ONDANSETRON HCL 4 MG/2ML IJ SOLN: 4.0000 mg | Freq: Four times a day (QID) | INTRAMUSCULAR | Status: DC | PRN

## 2021-12-05 MED ORDER — BUPIVACAINE HCL (PF) 0.5 % IJ SOLN
INTRAMUSCULAR | Status: DC | PRN
  Administered 2021-12-05: 10 mL

## 2021-12-05 MED ORDER — FENTANYL CITRATE (PF) 100 MCG/2ML IJ SOLN: 25.0000 ug | INTRAMUSCULAR | Status: DC | PRN

## 2021-12-05 MED ORDER — FENTANYL CITRATE (PF) 100 MCG/2ML IJ SOLN
INTRAMUSCULAR | Status: DC | PRN
  Administered 2021-12-05: 50 ug via INTRAVENOUS
  Administered 2021-12-05 (×2): 25 ug via INTRAVENOUS

## 2021-12-05 MED ORDER — TETANUS-DIPHTH-ACELL PERTUSSIS 5-2.5-18.5 LF-MCG/0.5 IM SUSY
0.5000 mL | PREFILLED_SYRINGE | Freq: Once | INTRAMUSCULAR | Status: AC
  Administered 2021-12-05: 0.5 mL via INTRAMUSCULAR
  Filled 2021-12-05: qty 0.5

## 2021-12-05 MED ORDER — PROPOFOL 10 MG/ML IV BOLUS
INTRAVENOUS | Status: DC | PRN
  Administered 2021-12-05: 140 mg via INTRAVENOUS

## 2021-12-05 MED ORDER — CEFAZOLIN SODIUM-DEXTROSE 2-4 GM/100ML-% IV SOLN
2.0000 g | Freq: Once | INTRAVENOUS | Status: AC
Start: 2021-12-05 — End: 2021-12-05
  Administered 2021-12-05: 2 g via INTRAVENOUS
  Filled 2021-12-05: qty 100

## 2021-12-05 MED ORDER — LIDOCAINE HCL (PF) 1 % IJ SOLN
INTRAMUSCULAR | Status: AC
  Filled 2021-12-05: qty 30

## 2021-12-05 MED ORDER — ONDANSETRON HCL 4 MG PO TABS: 4.0000 mg | ORAL_TABLET | Freq: Four times a day (QID) | ORAL | Status: DC | PRN

## 2021-12-05 MED ORDER — ONDANSETRON HCL 4 MG/2ML IJ SOLN: 4.0000 mg | Freq: Once | INTRAMUSCULAR | Status: DC | PRN

## 2021-12-05 MED ORDER — METOCLOPRAMIDE HCL 10 MG PO TABS: 5.0000 mg | ORAL_TABLET | Freq: Three times a day (TID) | ORAL | Status: DC | PRN

## 2021-12-05 SURGICAL SUPPLY — 17 items
BANDAGE GAUZE 1X75IN STRL (MISCELLANEOUS) IMPLANT
BNDG GAUZE 1X75IN STRL (MISCELLANEOUS) ×1
CHLORAPREP W/TINT 26 (MISCELLANEOUS) ×1 IMPLANT
GAUZE XEROFORM 1X8 LF (GAUZE/BANDAGES/DRESSINGS) ×1 IMPLANT
GLOVE BIO SURGEON STRL SZ8 (GLOVE) ×2 IMPLANT
GLOVE SURG UNDER LTX SZ8 (GLOVE) ×1 IMPLANT
GOWN STRL REUS W/ TWL LRG LVL3 (GOWN DISPOSABLE) ×1 IMPLANT
GOWN STRL REUS W/ TWL XL LVL3 (GOWN DISPOSABLE) ×1 IMPLANT
GOWN STRL REUS W/TWL LRG LVL3 (GOWN DISPOSABLE) ×1
GOWN STRL REUS W/TWL XL LVL3 (GOWN DISPOSABLE) ×1
KIT TURNOVER KIT A (KITS) ×1 IMPLANT
MANIFOLD NEPTUNE II (INSTRUMENTS) ×1 IMPLANT
NS IRRIG 500ML POUR BTL (IV SOLUTION) ×1 IMPLANT
PACK EXTREMITY ARMC (MISCELLANEOUS) ×1 IMPLANT
SPONGE GAUZE 2X2 8PLY STRL LF (GAUZE/BANDAGES/DRESSINGS) IMPLANT
TRAP FLUID SMOKE EVACUATOR (MISCELLANEOUS) ×1 IMPLANT
WATER STERILE IRR 500ML POUR (IV SOLUTION) ×1 IMPLANT

## 2021-12-05 NOTE — Anesthesia Procedure Notes (Signed)
Procedure Name: LMA Insertion Date/Time: 12/05/2021 2:44 PM  Performed by: Tammi Klippel, CRNAPre-anesthesia Checklist: Patient identified, Patient being monitored, Timeout performed, Emergency Drugs available and Suction available Patient Re-evaluated:Patient Re-evaluated prior to induction Oxygen Delivery Method: Circle system utilized Preoxygenation: Pre-oxygenation with 100% oxygen Induction Type: IV induction Ventilation: Mask ventilation without difficulty LMA: LMA inserted LMA Size: 4.0 Number of attempts: 1 Placement Confirmation: positive ETCO2 Tube secured with: Tape Dental Injury: Teeth and Oropharynx as per pre-operative assessment

## 2021-12-05 NOTE — Op Note (Signed)
12/05/2021  3:18 PM  Patient:   Nicole Velez  Pre-Op Diagnosis:   Retained foreign body with cellulitis, right index finger.  Post-Op Diagnosis:   Same  Procedure:   Irrigation and debridement with removal of retained foreign body, right index fingertip.  Surgeon:   Maryagnes Amos, MD  Assistant:   None  Anesthesia:   General LMA  Findings:   As above.  Complications:   None  Fluids:   500 cc crystalloid  EBL:   3 cc  UOP:   None  TT:   Digital tourniquet x approximately 10 minutes  Drains:   None  Closure:   None  Brief Clinical Note:   The patient is a 71 year old female who accidentally stabbed the tip of her right index finger with a toothpick while cleaning her sofa 2 to 3 days ago.  As she tried to remove the toothpick, the tip broke off inside her finger.  Over the next several days, she developed pain and swelling to the fingertip, prompting her to present to the emergency room.  She presents at this time for definitive management of this injury.  Procedure:   The patient was brought into the operating room and lain in the supine position.  After adequate general laryngeal mask anesthesia was obtained, the patient's right hand and upper extremity was prepped with ChloraPrep solution before being draped sterilely.  Preoperative antibiotics were administered.  A timeout was performed to verify the appropriate surgical site.  A digital tourniquet was then applied using a piece of a small surgical glove.  An approximate 1.5 cm incision was made longitudinally along the mid axis of the radial aspect of the right index fingertip, centered between the entrance and exit sites of the toothpick.  This incision was carried down through the subcutaneous tissues into the fatty tissue beneath.  The retained toothpick fragment was readily identified and removed in its entirety.  A small amount of purulent material was identified as well.  Further dissection was carried out into the  volar aspect of the finger, again to an I&D of a felon.    The wound was copiously irrigated with sterile saline solution using bulb irrigation before a sterile bulky compressive dressing was applied to the fingertip.  The patient was then awakened, extubated, and returned to the recovery room in satisfactory condition after tolerating the procedure well.

## 2021-12-05 NOTE — Anesthesia Preprocedure Evaluation (Addendum)
Anesthesia Evaluation  Patient identified by MRN, date of birth, ID band Patient awake    Reviewed: Allergy & Precautions, NPO status , Patient's Chart, lab work & pertinent test results  History of Anesthesia Complications Negative for: history of anesthetic complications  Airway Mallampati: II   Neck ROM: Full    Dental no notable dental hx.    Pulmonary neg pulmonary ROS   Pulmonary exam normal breath sounds clear to auscultation       Cardiovascular Exercise Tolerance: Good negative cardio ROS Normal cardiovascular exam Rhythm:Regular Rate:Normal     Neuro/Psych  Headaches    GI/Hepatic hiatal hernia,GERD  ,,  Endo/Other  negative endocrine ROS    Renal/GU Renal disease (CKD)     Musculoskeletal   Abdominal   Peds  Hematology negative hematology ROS (+)   Anesthesia Other Findings   Reproductive/Obstetrics                             Anesthesia Physical Anesthesia Plan  ASA: 2 and emergent  Anesthesia Plan: General   Post-op Pain Management:    Induction: Intravenous  PONV Risk Score and Plan: 3 and Ondansetron, Dexamethasone and Treatment may vary due to age or medical condition  Airway Management Planned: LMA  Additional Equipment:   Intra-op Plan:   Post-operative Plan: Extubation in OR  Informed Consent: I have reviewed the patients History and Physical, chart, labs and discussed the procedure including the risks, benefits and alternatives for the proposed anesthesia with the patient or authorized representative who has indicated his/her understanding and acceptance.     Dental advisory given  Plan Discussed with: CRNA  Anesthesia Plan Comments: (Patient consented for risks of anesthesia including but not limited to:  - adverse reactions to medications - damage to eyes, teeth, lips or other oral mucosa - nerve damage due to positioning  - sore throat or  hoarseness - damage to heart, brain, nerves, lungs, other parts of body or loss of life  Informed patient about role of CRNA in peri- and intra-operative care.  Patient voiced understanding.)       Anesthesia Quick Evaluation

## 2021-12-05 NOTE — Discharge Instructions (Addendum)
Orthopedic discharge instructions: Keep dressing dry and intact. Keep hand elevated above heart level. Apply ice to affected area frequently. Take ibuprofen 600 mg TID with meals for 5-7 days, then as necessary. Take ES Tylenol or pain medication as prescribed when needed.  Take antibiotic as ordered for 7 days. Return for follow-up in 10-14 days or as scheduled.  Follow up on Monday  AMBULATORY SURGERY  DISCHARGE INSTRUCTIONS   The drugs that you were given will stay in your system until tomorrow so for the next 24 hours you should not:  Drive an automobile Make any legal decisions Drink any alcoholic beverage   You may resume regular meals tomorrow.  Today it is better to start with liquids and gradually work up to solid foods.  You may eat anything you prefer, but it is better to start with liquids, then soup and crackers, and gradually work up to solid foods.   Please notify your doctor immediately if you have any unusual bleeding, trouble breathing, redness and pain at the surgery site, drainage, fever, or pain not relieved by medication.    Additional Instructions:        Please contact your physician with any problems or Same Day Surgery at [phone], Monday through Friday 6 am to 4 pm, or [place] at [facility] number at [phone].

## 2021-12-05 NOTE — ED Triage Notes (Signed)
Pt presents to the ED via POV due to a finger laceration. Pt states a toothpick puncture her index finger Saturday evening. Finger appears red, inflamed and tender to touch. Pt A&Ox4

## 2021-12-05 NOTE — ED Notes (Signed)
See triage note  Presents with injury to right index finger   States it was stuck with a toothpick this weekend  Finger is red and swollen  Tender to touch   Afebrile on arrival

## 2021-12-05 NOTE — Transfer of Care (Signed)
Immediate Anesthesia Transfer of Care Note  Patient: Nicole Velez  Procedure(s) Performed: IRRIGATION AND DEBRIDEMENT WOUND WITH FOREIGN BODY REMOVAL RIGHT INDEX FINGER (Right)  Patient Location: PACU  Anesthesia Type:General  Level of Consciousness: drowsy  Airway & Oxygen Therapy: Patient Spontanous Breathing and Patient connected to face mask oxygen  Post-op Assessment: Report given to RN and Post -op Vital signs reviewed and stable  Post vital signs: Reviewed and stable  Last Vitals:  Vitals Value Taken Time  BP 156/67 12/05/21 1517  Temp 35.9 1517  Pulse 69 12/05/21 1522  Resp 15 12/05/21 1522  SpO2 100 % 12/05/21 1522  Vitals shown include unvalidated device data.  Last Pain:  Vitals:   12/05/21 1311  TempSrc: Oral  PainSc: 0-No pain         Complications: No notable events documented.

## 2021-12-05 NOTE — ED Provider Notes (Signed)
[facility] Provider Note    Event Date/Time   First MD Initiated Contact with Patient 12/05/21 1002     (approximate)   History   Finger Injury   HPI  Nicole Velez is a 71 y.o. female right-hand-dominant who presents today for evaluation of right index finger injury.  Patient reports that she was cleaning the couch 2 days ago when she was poked in the finger by a toothpick.  She reports that it went through and through, however when she pulled it out it appeared to be broken so she is concerned that there is a piece stuck inside.  She reports that she has developed surrounding redness and pain since it happened.  No fevers or chills.  She still able to flex and extend.  Unsure of last tetanus shot.  There are no problems to display for this patient.         Physical Exam   Triage Vital Signs: ED Triage Vitals [12/05/21 0947]  Enc Vitals Group     BP (!) 159/71     Pulse Rate 73     Resp 18     Temp 97.7 F (36.5 C)     Temp Source Oral     SpO2 97 %     Weight      Height      Head Circumference      Peak Flow      Pain Score 8     Pain Loc      Pain Edu?      Excl. in GC?     Most recent vital signs: Vitals:   12/05/21 1255 12/05/21 1311  BP: (!) 158/60 (!) 163/71  Pulse: 70   Resp: 18 20  Temp:  (!) 97.4 F (36.3 C)  SpO2: 95% 100%    Physical Exam Vitals and nursing note reviewed.  Constitutional:      General: Awake and alert. No acute distress.    Appearance: Normal appearance. The patient is normal weight.  HENT:     Head: Normocephalic and atraumatic.     Mouth: Mucous membranes are moist.  Eyes:     General: PERRL. Normal EOMs        Right eye: No discharge.        Left eye: No discharge.     Conjunctiva/sclera: Conjunctivae normal.  Cardiovascular:     Rate and Rhythm: Normal rate and regular rhythm.     Pulses: Normal pulses.     Heart sounds: Normal heart sounds Pulmonary:     Effort: Pulmonary effort is  normal. No respiratory distress.     Breath sounds: Normal breath sounds.  Abdominal:     Abdomen is soft. There is no abdominal tenderness. Musculoskeletal:        General: No swelling. Normal range of motion.     Cervical back: Normal range of motion and neck supple.  Right index finger: puncture wound noted to radial aspect of the fingertip with surrounding erythema and tenderness.  No fusiform swelling.  No pain with active extension.  Not holding in flexion.  No active drainage.  No nailbed injury.  Able to flex and extend at isolated PIP and DIP against resistance. Skin:    General: Skin is warm and dry.     Capillary Refill: Capillary refill takes less than 2 seconds.     Findings: No rash.  Neurological:     Mental Status: The patient is awake and  alert.      ED Results / Procedures / Treatments   Labs (all labs ordered are listed, but only abnormal results are displayed) Labs Reviewed  BASIC METABOLIC PANEL - Abnormal; Notable for the following components:      Result Value   Glucose, Bld 101 (*)    Calcium 8.7 (*)    All other components within normal limits  CBC WITH DIFFERENTIAL/PLATELET     EKG     RADIOLOGY I independently reviewed and interpreted imaging and agree with radiologists findings.     PROCEDURES:  Critical Care performed:   Ultrasound ED Soft Tissue  Date/Time: 12/05/2021 2:30 PM  Performed by: Jackelyn Hoehn, PA-C Authorized by: Jackelyn Hoehn, PA-C   Procedure details:    Indications: limb pain, localization of abscess and evaluate for foreign body     Transverse view:  Visualized   Longitudinal view:  Visualized   Images: not archived   Location:    Location: upper extremity     Side:  Right Findings:     no foreign body present Comments:     Unable to visualize definitively foreign body using water bath    MEDICATIONS ORDERED IN ED: Medications  ceFAZolin (ANCEF) IVPB 2g/100 mL premix (has no administration in time  range)  ceFAZolin (ANCEF) 2-4 GM/100ML-% IVPB (has no administration in time range)  pentafluoroprop-tetrafluoroeth (GEBAUERS) aerosol (has no administration in time range)  Tdap (BOOSTRIX) injection 0.5 mL (0.5 mLs Intramuscular Given 12/05/21 1017)  ceFAZolin (ANCEF) IVPB 2g/100 mL premix (0 g Intravenous Stopped 12/05/21 1242)  lidocaine-EPINEPHrine-tetracaine (LET) topical gel (3 mLs Topical Given 12/05/21 1122)     IMPRESSION / MDM / ASSESSMENT AND PLAN / ED COURSE  I reviewed the triage vital signs and the nursing notes.   Differential diagnosis includes, but is not limited to, cellulitis, retained foreign body, less likely flexor tenosynovitis given lack of fusiform swelling, no pain with active extension, and holding in flexion, no tenderness along the flexor tendon sheath.  Patient is quite certain that there is a retained piece of wood.  This is not visualized on x-ray as it is not radiopaque.  Also cannot definitively see on ultrasound.  I concerned that this might progress to a flexor tenosynovitis if the foreign body is not removed.  I discussed with orthopedic surgery on-call who has agreed to do an incision and washout in the operating room.  Patient is also in agreement with this plan.  She was given a dose of Ancef as well as an updated tetanus shot.  She was instructed to remain NPO.  Labs were obtained preoperatively and are normal.  Patient was discharged in the emergency department to be admitted to the PACU.  Patient was discussed with Dr. York Cerise who agrees with assessment and plan.   Patient's presentation is most consistent with acute presentation with potential threat to life or bodily function.   Clinical Course as of 12/05/21 1431  Mon Dec 05, 2021  1035 Ortho paged [JP]  1050 Plan for OR at 315. Abx and NPO [JP]    Clinical Course User Index [JP] Debroh Sieloff, Herb Grays, PA-C     FINAL CLINICAL IMPRESSION(S) / ED DIAGNOSES   Final diagnoses:  Retained foreign  body of finger  Infection of finger     Rx / DC Orders   ED Discharge Orders     None        Note:  This document was prepared using Dragon voice recognition  software and may include unintentional dictation errors.   Keturah Shavers 12/05/21 1431    Loleta Rose, MD 12/05/21 [phone]

## 2021-12-05 NOTE — H&P (Signed)
Subjective:  Chief complaint: Right index finger pain, swelling, and erythema.  The patient is a 71 y.o. female who sustained an injury to the right index fingertip several days ago when she apparently stabbed herself with a toothpick while trying to clean out her couch.  The toothpick entered along the volar aspect of the radial side of her index fingertip and exited through the dorsal side of the fingertip.  She tried to pull it out but noted that a piece of the toothpick was missing, raising concern that a piece of the retained toothpick was still in her finger.  Over the next 2 days, the patient noted increased pain and swelling to the fingertip, prompting her to present to the emergency room this morning.  She was evaluated by the ER staff and orthopedic consultation was requested.  The patient presents at this time for definitive management of this injury.  There are no problems to display for this patient.  Past Medical History:  Diagnosis Date   Chickenpox    Chronic kidney disease    GERD (gastroesophageal reflux disease)    Helicobacter pylori gastritis    Hiatal hernia    Migraines    Osteopenia     Past Surgical History:  Procedure Laterality Date   ABDOMINAL HYSTERECTOMY     APPENDECTOMY     BILATERAL SALPINGOOPHORECTOMY  11/1993   [facility]   CHOLECYSTECTOMY  2016   COLONOSCOPY     ESOPHAGOGASTRODUODENOSCOPY (EGD) WITH PROPOFOL N/A 08/18/2015   Procedure: ESOPHAGOGASTRODUODENOSCOPY (EGD) WITH PROPOFOL;  Surgeon: Christena Deem, MD;  Location: [facility] ENDOSCOPY;  Service: Endoscopy;  Laterality: N/A;   HERNIA REPAIR     TONSILLECTOMY      Medications Prior to Admission  Medication Sig Dispense Refill Last Dose   GEMTESA 75 MG TABS Take 1 tablet by mouth daily.   12/05/2021   PREMARIN 0.625 MG tablet Take 0.625 mg by mouth daily.   3 12/05/2021   RABEprazole (ACIPHEX) 20 MG tablet Take 1 tablet by mouth daily.  3 12/05/2021   traZODone (DESYREL) 50 MG tablet Take by  mouth.   prn   HYDROcodone-acetaminophen (NORCO/VICODIN) 5-325 MG tablet Take 1 tablet by mouth every 4 (four) hours as needed for moderate pain. (Patient not taking: Reported on 12/05/2021) 20 tablet 0 Completed Course   Multiple Vitamin (MULTI-VITAMINS) TABS Take 1 tablet by mouth daily.    prn   MYRBETRIQ 25 MG TB24 tablet Take 1 tablet by mouth daily.  2 prn   naproxen (NAPROSYN) 500 MG tablet Take 1 tablet (500 mg total) by mouth 2 (two) times daily with a meal. (Patient not taking: Reported on 12/05/2021) 20 tablet 2 Not Taking   ondansetron (ZOFRAN ODT) 4 MG disintegrating tablet Take 1 tablet (4 mg total) by mouth every 8 (eight) hours as needed for nausea or vomiting. 20 tablet 0 prn   tamsulosin (FLOMAX) 0.4 MG CAPS capsule Take 1 capsule (0.4 mg total) by mouth daily. 7 capsule 0 prn   No Known Allergies  Social History   Tobacco Use   Smoking status: Never   Smokeless tobacco: Never  Substance Use Topics   Alcohol use: Yes    Alcohol/week: 0.0 standard drinks of alcohol    Comment: occasional    Family History  Problem Relation Age of Onset   Hypertension Father    Breast cancer Neg Hx      Review of Systems: As noted above. The patient denies any chest pain, shortness of breath,  nausea, vomiting, diarrhea, constipation, belly pain, blood in his/her stool, or burning with urination.  Objective: Temp:  [97.4 F (36.3 C)-97.8 F (36.6 C)] 97.4 F (36.3 C) (11/20 1311) Pulse Rate:  [26-73] 70 (11/20 1255) Resp:  [18-20] 20 (11/20 1311) BP: (158-167)/(60-71) 163/71 (11/20 1311) SpO2:  [95 %-100 %] 100 % (11/20 1311) Weight:  [65.3 kg] 65.3 kg (11/20 1009)  Physical Exam: General:  Alert, no acute distress Psychiatric:  Patient is competent for consent with normal mood and affect Cardiovascular:  RRR  Respiratory:  Clear to auscultation. No wheezing. Non-labored breathing GI:  Abdomen is soft and non-tender Skin:  No lesions in the area of chief  complaint Neurologic:  Sensation intact distally Lymphatic:  No axillary or cervical lymphadenopathy  Orthopedic Exam:  Orthopedic examination is limited to the right hand.  There is moderate swelling and erythema over the right index tip extending to just proximal to the DIP flexion crease, as well as separate puncture wounds all over the volar and dorsal aspects of the radial side of her fingertip, corresponding to the entrance and exit sites of the toothpick.  No ecchymosis, abrasions, or other skin abnormalities are identified.  She has moderate to severe tenderness to even light touch along the radial aspect of the fingertip.  She is able to actively flex and extend her index finger without pain or triggering.  She is neurovascularly intact to all digits.  Imaging Review: Recent x-rays of the right index finger are available for review and have been reviewed by myself.  These films demonstrate no evidence for fractures, lytic lesions, or significant degenerative changes..  Assessment: Right index finger cellulitis with probable retained foreign body status post puncture wound.  Plan: The treatment options, including both surgical and nonsurgical choices, have been discussed in detail with the patient.  The patient would like to proceed with surgical intervention to include an irrigation and debridement of the radial aspect of the finger with removal of the retained foreign body.  The risks (including bleeding, infection, nerve and/or blood vessel injury, persistent or recurrent pain, persistent or recurrent infection, need for further surgery, blood clots, strokes, heart attacks or arrhythmias, pneumonia, etc.) and benefits of the surgical procedure were discussed.  The patient states her understanding and agrees to proceed.  She agrees to a blood transfusion if necessary.  A formal written consent will be obtained by the nursing staff.

## 2021-12-06 ENCOUNTER — Encounter: Payer: Self-pay | Admitting: Surgery

## 2021-12-10 NOTE — Anesthesia Postprocedure Evaluation (Signed)
Anesthesia Post Note  Patient: Nicole Velez  Procedure(s) Performed: IRRIGATION AND DEBRIDEMENT WOUND WITH FOREIGN BODY REMOVAL RIGHT INDEX FINGER (Right)  Patient location during evaluation: PACU Anesthesia Type: General Level of consciousness: awake and alert Pain management: pain level controlled Vital Signs Assessment: post-procedure vital signs reviewed and stable Respiratory status: spontaneous breathing, nonlabored ventilation, respiratory function stable and patient connected to nasal cannula oxygen Cardiovascular status: blood pressure returned to baseline and stable Postop Assessment: no apparent nausea or vomiting Anesthetic complications: no   No notable events documented.   Last Vitals:  Vitals:   12/05/21 1536 12/05/21 1548  BP: (!) 162/77 (!) 166/77  Pulse: 79 77  Resp: 14 18  Temp:  (!) 36.1 C  SpO2: 99% 98%    Last Pain:  Vitals:   12/05/21 1548  TempSrc: Temporal  PainSc: 0-No pain                 Lenard Simmer

## 2021-12-22 ENCOUNTER — Emergency Department: Payer: Medicare Other

## 2021-12-22 ENCOUNTER — Emergency Department
Admission: EM | Admit: 2021-12-22 | Discharge: 2021-12-22 | Disposition: A | Discharge status: Home / Self Care | Payer: Medicare Other | Attending: Emergency Medicine | Admitting: Emergency Medicine

## 2021-12-22 DIAGNOSIS — M25512 Pain in left shoulder: Secondary | ICD-10-CM | POA: Diagnosis not present

## 2021-12-22 DIAGNOSIS — R519 Headache, unspecified: Secondary | ICD-10-CM | POA: Insufficient documentation

## 2021-12-22 DIAGNOSIS — S39012A Strain of muscle, fascia and tendon of lower back, initial encounter: Secondary | ICD-10-CM | POA: Insufficient documentation

## 2021-12-22 DIAGNOSIS — M7918 Myalgia, other site: Secondary | ICD-10-CM

## 2021-12-22 DIAGNOSIS — S161XXA Strain of muscle, fascia and tendon at neck level, initial encounter: Secondary | ICD-10-CM | POA: Diagnosis not present

## 2021-12-22 DIAGNOSIS — S199XXA Unspecified injury of neck, initial encounter: Secondary | ICD-10-CM | POA: Diagnosis present

## 2021-12-22 DIAGNOSIS — Y92410 Unspecified street and highway as the place of occurrence of the external cause: Secondary | ICD-10-CM | POA: Insufficient documentation

## 2021-12-22 DIAGNOSIS — M25562 Pain in left knee: Secondary | ICD-10-CM | POA: Insufficient documentation

## 2021-12-22 DIAGNOSIS — M25561 Pain in right knee: Secondary | ICD-10-CM | POA: Insufficient documentation

## 2021-12-22 DIAGNOSIS — M79641 Pain in right hand: Secondary | ICD-10-CM | POA: Diagnosis not present

## 2021-12-22 MED ORDER — KETOROLAC TROMETHAMINE 15 MG/ML IJ SOLN
15.0000 mg | Freq: Once | INTRAMUSCULAR | Status: AC
  Administered 2021-12-22: 15 mg via INTRAMUSCULAR
  Filled 2021-12-22: qty 1

## 2021-12-22 MED ORDER — HYDROCODONE-ACETAMINOPHEN 5-325 MG PO TABS: 1.0000 | ORAL_TABLET | Freq: Four times a day (QID) | ORAL | 0 refills | Status: DC | PRN
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
## 2022-01-31 ENCOUNTER — Ambulatory Visit: Payer: 59 | Attending: Family Medicine | Admitting: Physical Therapy

## 2022-01-31 ENCOUNTER — Encounter: Payer: Self-pay | Admitting: Physical Therapy

## 2022-01-31 DIAGNOSIS — M542 Cervicalgia: Secondary | ICD-10-CM | POA: Diagnosis present
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
## 2022-02-01 ENCOUNTER — Encounter: Payer: Self-pay | Admitting: Physical Therapy

## 2022-02-02 ENCOUNTER — Ambulatory Visit: Payer: 59 | Admitting: Physical Therapy

## 2022-02-02 ENCOUNTER — Encounter: Payer: Self-pay | Admitting: Physical Therapy

## 2022-02-02 DIAGNOSIS — M542 Cervicalgia: Secondary | ICD-10-CM
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
## 2022-02-07 ENCOUNTER — Encounter: Payer: Self-pay | Admitting: Physical Therapy

## 2022-02-07 ENCOUNTER — Ambulatory Visit: Payer: 59 | Admitting: Physical Therapy

## 2022-02-07 DIAGNOSIS — M542 Cervicalgia: Secondary | ICD-10-CM
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
## 2022-02-09 ENCOUNTER — Ambulatory Visit: Payer: 59 | Admitting: Physical Therapy

## 2022-02-09 ENCOUNTER — Encounter: Payer: Self-pay | Admitting: Physical Therapy

## 2022-02-09 DIAGNOSIS — M542 Cervicalgia: Secondary | ICD-10-CM
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
## 2022-02-14 ENCOUNTER — Encounter: Payer: Self-pay | Admitting: Physical Therapy

## 2022-02-14 ENCOUNTER — Ambulatory Visit: Payer: 59 | Admitting: Physical Therapy

## 2022-02-14 DIAGNOSIS — M542 Cervicalgia: Secondary | ICD-10-CM | POA: Diagnosis not present
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
## 2022-02-16 ENCOUNTER — Ambulatory Visit: Payer: 59 | Attending: Family Medicine | Admitting: Physical Therapy

## 2022-02-16 ENCOUNTER — Encounter: Payer: Self-pay | Admitting: Physical Therapy

## 2022-02-16 DIAGNOSIS — M542 Cervicalgia: Secondary | ICD-10-CM | POA: Insufficient documentation
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
## 2022-02-20 ENCOUNTER — Encounter: Payer: Self-pay | Admitting: Physical Therapy

## 2022-02-20 ENCOUNTER — Ambulatory Visit: Payer: 59 | Admitting: Physical Therapy

## 2022-02-20 DIAGNOSIS — M542 Cervicalgia: Secondary | ICD-10-CM | POA: Diagnosis not present
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
## 2022-02-21 ENCOUNTER — Encounter: Payer: Self-pay | Admitting: Physical Therapy

## 2022-02-23 ENCOUNTER — Ambulatory Visit: Payer: 59 | Admitting: Physical Therapy

## 2022-02-23 ENCOUNTER — Encounter: Payer: Self-pay | Admitting: Physical Therapy

## 2022-02-23 DIAGNOSIS — M542 Cervicalgia: Secondary | ICD-10-CM | POA: Diagnosis not present
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
## 2022-02-28 ENCOUNTER — Encounter: Payer: Self-pay | Admitting: Physical Therapy

## 2022-02-28 ENCOUNTER — Ambulatory Visit: Payer: 59 | Admitting: Physical Therapy

## 2022-02-28 DIAGNOSIS — M542 Cervicalgia: Secondary | ICD-10-CM
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
## 2022-03-02 ENCOUNTER — Encounter: Payer: Medicare Other | Admitting: Physical Therapy

## 2022-03-14 ENCOUNTER — Ambulatory Visit (INDEPENDENT_AMBULATORY_CARE_PROVIDER_SITE_OTHER): Payer: 59 | Admitting: Urology

## 2022-03-14 ENCOUNTER — Encounter: Payer: Self-pay | Admitting: Physical Therapy

## 2022-03-14 ENCOUNTER — Ambulatory Visit: Payer: 59 | Admitting: Physical Therapy

## 2022-03-14 VITALS — BP 145/72 | HR 74 | Ht 62.0 in | Wt 148.2 lb

## 2022-03-14 DIAGNOSIS — N3281 Overactive bladder: Secondary | ICD-10-CM

## 2022-03-14 DIAGNOSIS — R3129 Other microscopic hematuria: Secondary | ICD-10-CM

## 2022-03-14 DIAGNOSIS — M542 Cervicalgia: Secondary | ICD-10-CM | POA: Diagnosis not present

## 2022-03-14 DIAGNOSIS — Z87442 Personal history of urinary calculi: Secondary | ICD-10-CM

## 2022-03-14 LAB — URINALYSIS, COMPLETE
Bilirubin, UA: NEGATIVE
Glucose, UA: NEGATIVE
Ketones, UA: NEGATIVE
Leukocytes,UA: NEGATIVE
Nitrite, UA: NEGATIVE
Protein,UA: NEGATIVE
Specific Gravity, UA: 1.02 (ref 1.005–1.030)
Urobilinogen, Ur: 0.2 mg/dL (ref 0.2–1.0)
pH, UA: 5.5 (ref 5.0–7.5)

## 2022-03-14 LAB — MICROSCOPIC EXAMINATION: Epithelial Cells (non renal): >10 /hpf — AB (ref 0–10)
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
## 2022-03-14 NOTE — Progress Notes (Signed)
I,Amy L Pierron,acting as a scribe for Hollice Espy, MD.,have documented all relevant documentation on the behalf of Hollice Espy, MD,as directed by  Hollice Espy, MD while in the presence of Hollice Espy, MD.  03/14/2022 11:42 AM   Nicole Velez 1950-11-12 [id number]  Referring provider: Dion Body, MD [place] [facility] [place],  [place] [address]  Chief Complaint  Patient presents with   Establish Care   history kidney stones    HPI: 72 year-old female referred to clinic for further evaluation of kidney stones.  She reports that she presents today primarily to establish care.    She was formerly a patient of Dr. Yves Dill, who is managing her overactive bladder and kidney stones. She's on Gemtesa 75 mg.   Reviewed multiple studies including her most recent CT of the lumbar spine that was on December 22, 2021. It showed no obvious stone disease, although the kidneys were incompletely imaged. A CT abdomen pelvis from February 15, 2021, which also shows no stones. She has a left lower pole renal cyst.    Her urine today has 3-10 red blood cells are greater than 10 epithelial cells, many bacterias. It's a contaminated sample.   She reports passing her first kidney stone about 6-7 years ago. Her second one was from her right kidney and was not as large so not as painful. She has never had surgery for them. She is not having any symptoms of a stone at this time. Her urinary symptoms are managed on the [facility] and she is pleased with the results.    PMH: Past Medical History:  Diagnosis Date   Chickenpox    Chronic kidney disease    GERD (gastroesophageal reflux disease)    Helicobacter pylori gastritis    Hiatal hernia    Migraines    Osteopenia     Surgical History: Past Surgical History:  Procedure Laterality Date   ABDOMINAL HYSTERECTOMY     APPENDECTOMY     BILATERAL SALPINGOOPHORECTOMY  11/1993   [facility]   CHOLECYSTECTOMY  2016    COLONOSCOPY     ESOPHAGOGASTRODUODENOSCOPY (EGD) WITH PROPOFOL N/A 08/18/2015   Procedure: ESOPHAGOGASTRODUODENOSCOPY (EGD) WITH PROPOFOL;  Surgeon: Lollie Sails, MD;  Location: [facility] ENDOSCOPY;  Service: Endoscopy;  Laterality: N/A;   HERNIA REPAIR     INCISION AND DRAINAGE OF WOUND Right 12/05/2021   Procedure: IRRIGATION AND DEBRIDEMENT WOUND WITH FOREIGN BODY REMOVAL RIGHT INDEX FINGER;  Surgeon: Corky Mull, MD;  Location: [facility] ORS;  Service: Orthopedics;  Laterality: Right;   TONSILLECTOMY      Home Medications:  Allergies as of 03/14/2022   No Known Allergies      Medication List        Accurate as of March 14, 2022 11:42 AM. If you have any questions, ask your nurse or doctor.          STOP taking these medications    cephALEXin 500 MG capsule Commonly known as: Keflex Stopped by: Hollice Espy, MD   HYDROcodone-acetaminophen 5-325 MG tablet Commonly known as: NORCO/VICODIN Stopped by: Hollice Espy, MD   Myrbetriq 25 MG Tb24 tablet Generic drug: mirabegron ER Stopped by: Hollice Espy, MD   ondansetron 4 MG disintegrating tablet Commonly known as: Zofran ODT Stopped by: Hollice Espy, MD   tamsulosin 0.4 MG Caps capsule Commonly known as: Flomax Stopped by: Hollice Espy, MD   traZODone 50 MG tablet Commonly known as: DESYREL Stopped by: Hollice Espy, MD  TAKE these medications    Gemtesa 75 MG Tabs Generic drug: Vibegron Take 1 tablet by mouth daily.   Multi-Vitamins Tabs Take 1 tablet by mouth daily.   Premarin 0.625 MG tablet Generic drug: estrogens (conjugated) Take 0.625 mg by mouth daily.   RABEprazole 20 MG tablet Commonly known as: ACIPHEX Take 1 tablet by mouth daily.        Family History: Family History  Problem Relation Age of Onset   Hypertension Father    Breast cancer Neg Hx     Social History:  reports that she has never smoked. She has never used smokeless tobacco. She reports current alcohol  use. She reports that she does not use drugs.   Physical Exam: BP (!) 145/72   Pulse 74   Ht '5\' 2"'$  (1.575 m)   Wt 148 lb 4 oz (67.2 kg)   BMI 27.12 kg/m   Constitutional:  Alert and oriented, No acute distress. HEENT: [place] AT, moist mucus membranes.  Trachea midline, no masses. Neurologic: Grossly intact, no focal deficits, moving all 4 extremities. Psychiatric: Normal mood and affect.  Pertinent Imaging: EXAM: CT ABDOMEN AND PELVIS WITH CONTRAST   TECHNIQUE: Multidetector CT imaging of the abdomen and pelvis was performed using the standard protocol following bolus administration of intravenous contrast.   RADIATION DOSE REDUCTION: This exam was performed according to the departmental dose-optimization program which includes automated exposure control, adjustment of the mA and/or kV according to patient size and/or use of iterative reconstruction technique.   CONTRAST:  126m OMNIPAQUE IOHEXOL 300 MG/ML  SOLN   COMPARISON:  09/25/2017   FINDINGS: Lower chest: 3 mm nodule in the right lower lobe on sequence 3, image 1 is stable since 2019 and compatible with a benign finding. No pleural effusions.   Hepatobiliary: Diffuse low-density in the liver is suggestive for steatosis. No discrete liver lesion. Cholecystectomy.   Pancreas: Unremarkable. No pancreatic ductal dilatation or surrounding inflammatory changes.   Spleen: Normal in size without focal abnormality.   Adrenals/Urinary Tract: Again noted is mild fullness in the left adrenal gland. Normal appearance of the right adrenal gland. 5.2 cm low-density cyst in the left kidney lower pole. Negative for hydronephrosis. No suspicious renal lesions. Mild fullness of renal pelvis bilaterally. Urinary bladder is decompressed.   Stomach/Bowel: Mild distention of the stomach containing oral contrast. Normal appearance of duodenum and small bowel. No evidence for acute bowel inflammation.   Vascular/Lymphatic: Main  portal venous system is patent. Atherosclerotic calcifications in the abdominal aorta without aneurysm or significant stenosis. Main visceral arteries are patent.   Reproductive: Status post hysterectomy. No adnexal masses.   Other: Negative for ascites. Negative for free air. No significant ventral or inguinal hernias.   Musculoskeletal: Focal sclerosis in the right ilium appears stable and has benign characteristics. No acute bone abnormality.   IMPRESSION: 1. No acute abnormality in the abdomen or pelvis. 2. Liver is low density and suggestive for hepatic steatosis. 3. Prominent left renal cyst.    Electronically Signed   By: AMarkus DaftM.D.   On: 02/16/2021 15:00 EXAM: CT LUMBAR SPINE WITHOUT CONTRAST   TECHNIQUE: Multidetector CT imaging of the lumbar spine was performed without intravenous contrast administration. Multiplanar CT image reconstructions were also generated.   RADIATION DOSE REDUCTION: This exam was performed according to the departmental dose-optimization program which includes automated exposure control, adjustment of the mA and/or kV according to patient size and/or use of iterative reconstruction technique.   COMPARISON:  Prior  MRI from 09/22/2018.   FINDINGS: Segmentation: Standard. Lowest well-formed disc space labeled the L5-S1 level.   Alignment: Trace degenerative anterolisthesis of L4 on L5. Alignment otherwise normal preservation of the normal lumbar lordosis.   Vertebrae: Vertebral body height maintained without acute or chronic fracture. Visualized sacrum and pelvis intact. No discrete or worrisome osseous lesions.   Paraspinal and other soft tissues: Paraspinous soft tissues demonstrate no acute finding. Mild aortic atherosclerosis. 4.7 cm simple left renal cyst noted, benign in appearance, no follow-up imaging recommended.   Disc levels:   L1-2:  Negative interspace.  Mild facet hypertrophy.  No stenosis.   L2-3:  Negative  interspace.  Mild facet hypertrophy.  No stenosis.   L3-4: Mild disc bulge. No significant spinal stenosis. Foramina remain patent.   L4-5: Mild diffuse disc bulge. Moderate right worse than left facet hypertrophy. No spinal stenosis. Foramina remain patent.   L5-S1: Left subarticular disc protrusion closely approximates and/or contacts the descending left S1 nerve root. Mild to moderate facet hypertrophy. No significant spinal stenosis. Foramina remain patent.   IMPRESSION: 1. No acute osseous injury within the lumbar spine. 2. Left subarticular disc protrusion at L5-S1, potentially affecting the descending left S1 nerve root.   Aortic Atherosclerosis (ICD10-I70.0).   Electronically Signed   By: Jeannine Boga M.D.   On: 12/22/2021 19:39 Personally reviewed today and agree with radiologic interpretation.  Results for orders placed or performed in visit on 03/14/22  Microscopic Examination   Urine  Result Value Ref Range   WBC, UA 0-5 0 - 5 /hpf   RBC, Urine 3-10 (A) 0 - 2 /hpf   Epithelial Cells (non renal) >10 (A) 0 - 10 /hpf   Bacteria, UA Many (A) None seen/Few  Urinalysis, Complete  Result Value Ref Range   Specific Gravity, UA 1.020 1.005 - 1.030   pH, UA 5.5 5.0 - 7.5   Color, UA Yellow Yellow   Appearance Ur Hazy (A) Clear   Leukocytes,UA Negative Negative   Protein,UA Negative Negative/Trace   Glucose, UA Negative Negative   Ketones, UA Negative Negative   RBC, UA 1+ (A) Negative   Bilirubin, UA Negative Negative   Urobilinogen, Ur 0.2 0.2 - 1.0 mg/dL   Nitrite, UA Negative Negative   Microscopic Examination See below:     Assessment & Plan:    Microscopic Hematuria  - It appeared to be a contaminated sample of many epithelial cells.  - We discussed optimal collection technique with a fuller bladder, midstream collection, using a wipe.  - She'll return in about two weeks for another urinalysis. If she has microscopic blood in her urine at that  time, we'll recommend a cystoscopy with consideration of additional imaging.  2. History of kidney stones  - Asymptomatic  - No stones on recent studies which were reviewed today.   - We reviewed stone diet recommendations.   3. Overactive Bladder  - Will continue on Gemtesa.   - Follow up with a PA next year for a symptoms recheck.   Return in about 2 weeks (around 03/28/2022) for UA.  I have reviewed the above documentation for accuracy and completeness, and I agree with the above.   Hollice Espy, MD   [facility] [address], [place], [place] [address] [phone]

## 2022-03-22 ENCOUNTER — Ambulatory Visit: Payer: 59 | Attending: Family Medicine | Admitting: Physical Therapy

## 2022-03-22 ENCOUNTER — Encounter: Payer: Self-pay | Admitting: Physical Therapy

## 2022-03-22 DIAGNOSIS — M542 Cervicalgia: Secondary | ICD-10-CM | POA: Insufficient documentation
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
## 2022-03-28 ENCOUNTER — Encounter: Payer: Self-pay | Admitting: Physical Therapy

## 2022-03-28 ENCOUNTER — Ambulatory Visit: Payer: 59 | Admitting: Physical Therapy

## 2022-03-28 DIAGNOSIS — M542 Cervicalgia: Secondary | ICD-10-CM | POA: Diagnosis not present
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
## 2022-03-29 ENCOUNTER — Other Ambulatory Visit: Payer: 59

## 2022-03-29 ENCOUNTER — Telehealth: Payer: Self-pay | Admitting: Family Medicine

## 2022-03-29 ENCOUNTER — Other Ambulatory Visit: Payer: Self-pay | Admitting: Family Medicine

## 2022-03-29 DIAGNOSIS — R3129 Other microscopic hematuria: Secondary | ICD-10-CM

## 2022-03-29 DIAGNOSIS — N3281 Overactive bladder: Secondary | ICD-10-CM

## 2022-03-29 DIAGNOSIS — Z87442 Personal history of urinary calculi: Secondary | ICD-10-CM

## 2022-03-29 LAB — URINALYSIS, COMPLETE
Bilirubin, UA: NEGATIVE
Glucose, UA: NEGATIVE
Ketones, UA: NEGATIVE
Leukocytes,UA: NEGATIVE
Nitrite, UA: NEGATIVE
Protein,UA: NEGATIVE
Specific Gravity, UA: 1.015 (ref 1.005–1.030)
Urobilinogen, Ur: 0.2 mg/dL (ref 0.2–1.0)
pH, UA: 6 (ref 5.0–7.5)

## 2022-03-29 LAB — MICROSCOPIC EXAMINATION: Epithelial Cells (non renal): >10 /hpf — AB (ref 0–10)

## 2022-03-29 NOTE — Telephone Encounter (Signed)
Patient come in office today for a urine drop off. There is still blood in the urine and per patient's last office visit she will need to be scheduled for a CT scan and a Cysto. I have put a order in for the CT and left a message for patient to call back to schedule the Cysto about 1 month out.

## 2022-03-29 NOTE — Telephone Encounter (Signed)
Pt aware.  Appt made 4/16 at 1015.

## 2022-03-30 ENCOUNTER — Ambulatory Visit: Payer: 59 | Admitting: Physical Therapy

## 2022-04-04 ENCOUNTER — Encounter: Payer: 59 | Admitting: Physical Therapy

## 2022-04-10 ENCOUNTER — Encounter: Payer: 59 | Admitting: Physical Therapy

## 2022-04-12 ENCOUNTER — Encounter: Payer: 59 | Admitting: Physical Therapy

## 2022-04-19 ENCOUNTER — Encounter: Payer: 59 | Admitting: Physical Therapy

## 2022-04-19 ENCOUNTER — Ambulatory Visit
Admission: RE | Admit: 2022-04-19 | Discharge: 2022-04-19 | Disposition: A | Discharge status: Home / Self Care | Payer: 59 | Source: Ambulatory Visit | Attending: Urology | Admitting: Urology

## 2022-04-19 DIAGNOSIS — R3129 Other microscopic hematuria: Secondary | ICD-10-CM

## 2022-04-19 LAB — POCT I-STAT CREATININE: Creatinine, Ser: 0.8 mg/dL (ref 0.44–1.00)

## 2022-04-19 MED ORDER — IOHEXOL 300 MG/ML  SOLN
100.0000 mL | Freq: Once | INTRAMUSCULAR | Status: AC | PRN
  Administered 2022-04-19: 100 mL via INTRAVENOUS

## 2022-04-26 ENCOUNTER — Encounter: Payer: 59 | Admitting: Physical Therapy

## 2022-05-02 ENCOUNTER — Other Ambulatory Visit: Payer: 59 | Admitting: Urology

## 2022-05-03 ENCOUNTER — Encounter: Payer: Self-pay | Admitting: Urology

## 2022-05-03 ENCOUNTER — Encounter: Payer: 59 | Admitting: Physical Therapy

## 2022-05-10 ENCOUNTER — Encounter: Payer: 59 | Admitting: Physical Therapy

## 2022-05-30 ENCOUNTER — Encounter: Payer: Self-pay | Admitting: Urology

## 2022-05-30 ENCOUNTER — Ambulatory Visit (INDEPENDENT_AMBULATORY_CARE_PROVIDER_SITE_OTHER): Payer: 59 | Admitting: Urology

## 2022-05-30 VITALS — BP 135/73 | HR 70 | Ht 62.0 in | Wt 149.0 lb

## 2022-05-30 DIAGNOSIS — R3129 Other microscopic hematuria: Secondary | ICD-10-CM

## 2022-05-30 DIAGNOSIS — N3281 Overactive bladder: Secondary | ICD-10-CM

## 2022-05-30 LAB — MICROSCOPIC EXAMINATION

## 2022-05-30 LAB — URINALYSIS, COMPLETE
Bilirubin, UA: NEGATIVE
Glucose, UA: NEGATIVE
Ketones, UA: NEGATIVE
Leukocytes,UA: NEGATIVE
Nitrite, UA: NEGATIVE
Protein,UA: NEGATIVE
Specific Gravity, UA: <1.005 — ABNORMAL LOW (ref 1.005–1.030)
Urobilinogen, Ur: 0.2 mg/dL (ref 0.2–1.0)
pH, UA: 5.5 (ref 5.0–7.5)

## 2022-05-30 NOTE — Progress Notes (Signed)
   05/30/22  CC:  Chief Complaint  Patient presents with   Cysto    HPI: 72 year old female who presents today for cystoscopy for workup of microscopic hematuria.  She underwent CT urogram which was unremarkable, no GU pathology appreciated.  Blood pressure 135/73, pulse 70, height 5\' 2"  (1.575 m), weight 149 lb (67.6 kg). NED. A&Ox3.   No respiratory distress   Abd soft, NT, ND Normal external genitalia with patent urethral meatus  Cystoscopy Procedure Note  Patient identification was confirmed, informed consent was obtained, and patient was prepped using Betadine solution.  Lidocaine jelly was administered per urethral meatus.    Procedure: - Flexible cystoscope introduced, without any difficulty.   - Thorough search of the bladder revealed:    normal urethral meatus    normal urothelium    no stones    no ulcers     no tumors    no urethral polyps    no trabeculation  - Ureteral orifices were normal in position and appearance.  Post-Procedure: - Patient tolerated the procedure well  Assessment/ Plan:  1. Microhematuria Status post negative workup including cystoscopy today as well as CT urogram which is reassuring - Urinalysis, Complete  2. OAB (overactive bladder) Continue Myrbetriq  F/u 1 year with PA  Vanna [place], MD

## 2022-07-03 ENCOUNTER — Telehealth: Payer: Self-pay | Admitting: Urology

## 2022-07-03 MED ORDER — GEMTESA 75 MG PO TABS: 1.0000 | ORAL_TABLET | Freq: Every day | ORAL | 3 refills | Status: DC

## 2022-07-03 NOTE — Telephone Encounter (Signed)
Pt currently sees Dr Apolinar Junes and Carollee Herter.  She needs a refill for Gemtesa 75 mg sent to [facility] on [address]  It would be a new RX from our office, previous RX was from Dr Evelene Croon.  Pt had cysto 05/2022.

## 2022-07-03 NOTE — Telephone Encounter (Signed)
Refill sent in. Patient advised. Wanted 90 day supply.

## 2022-07-14 ENCOUNTER — Encounter: Payer: Self-pay | Admitting: Ophthalmology

## 2022-07-14 NOTE — Anesthesia Preprocedure Evaluation (Addendum)
Anesthesia Evaluation  Patient identified by MRN, date of birth, ID band Patient awake    Reviewed: Allergy & Precautions, H&P , NPO status , Patient's Chart, lab work & pertinent test results  Airway Mallampati: II  TM Distance: >3 FB Neck ROM: Full    Dental no notable dental hx.    Pulmonary neg pulmonary ROS   Pulmonary exam normal breath sounds clear to auscultation       Cardiovascular negative cardio ROS Normal cardiovascular exam Rhythm:Regular Rate:Normal     Neuro/Psych  Headaches  Neuromuscular disease negative neurological ROS  negative psych ROS   GI/Hepatic negative GI ROS, Neg liver ROS, hiatal hernia,GERD  ,,  Endo/Other  negative endocrine ROS    Renal/GU Renal diseasenegative Renal ROS  negative genitourinary   Musculoskeletal negative musculoskeletal ROS (+)    Abdominal   Peds negative pediatric ROS (+)  Hematology negative hematology ROS (+)   Anesthesia Other Findings Chronic kidney disease  Hiatal hernia GERD (gastroesophageal reflux disease) Helicobacter pylori gastritis  Migraines Osteopenia  Barretts esophagus Hx nephrolithiasis    Reproductive/Obstetrics negative OB ROS                             Anesthesia Physical Anesthesia Plan  ASA: 2  Anesthesia Plan: MAC   Post-op Pain Management:    Induction: Intravenous  PONV Risk Score and Plan:   Airway Management Planned: Natural Airway and Nasal Cannula  Additional Equipment:   Intra-op Plan:   Post-operative Plan:   Informed Consent: I have reviewed the patients History and Physical, chart, labs and discussed the procedure including the risks, benefits and alternatives for the proposed anesthesia with the patient or authorized representative who has indicated his/her understanding and acceptance.     Dental Advisory Given  Plan Discussed with: Anesthesiologist, CRNA and  Surgeon  Anesthesia Plan Comments: (Patient consented for risks of anesthesia including but not limited to:  - adverse reactions to medications - damage to eyes, teeth, lips or other oral mucosa - nerve damage due to positioning  - sore throat or hoarseness - Damage to heart, brain, nerves, lungs, other parts of body or loss of life  Patient voiced understanding.)       Anesthesia Quick Evaluation

## 2022-07-18 ENCOUNTER — Other Ambulatory Visit: Payer: Self-pay | Admitting: Obstetrics and Gynecology

## 2022-07-18 DIAGNOSIS — Z1231 Encounter for screening mammogram for malignant neoplasm of breast: Secondary | ICD-10-CM

## 2022-07-18 NOTE — Discharge Instructions (Signed)

## 2022-07-25 ENCOUNTER — Ambulatory Visit: Payer: 59 | Admitting: Anesthesiology

## 2022-07-25 ENCOUNTER — Encounter: Payer: Self-pay | Admitting: Ophthalmology

## 2022-07-25 ENCOUNTER — Ambulatory Visit
Admission: RE | Admit: 2022-07-25 | Discharge: 2022-07-25 | Disposition: A | Discharge status: Home / Self Care | Payer: 59 | Attending: Ophthalmology | Admitting: Ophthalmology

## 2022-07-25 ENCOUNTER — Other Ambulatory Visit: Payer: Self-pay

## 2022-07-25 ENCOUNTER — Encounter
Admission: RE | Disposition: A | Discharge status: Home / Self Care | Payer: Self-pay | Source: Home / Self Care | Attending: Ophthalmology

## 2022-07-25 DIAGNOSIS — M8580 Other specified disorders of bone density and structure, unspecified site: Secondary | ICD-10-CM | POA: Insufficient documentation

## 2022-07-25 DIAGNOSIS — K219 Gastro-esophageal reflux disease without esophagitis: Secondary | ICD-10-CM | POA: Insufficient documentation

## 2022-07-25 DIAGNOSIS — H2512 Age-related nuclear cataract, left eye: Secondary | ICD-10-CM | POA: Diagnosis present

## 2022-07-25 HISTORY — DX: Personal history of urinary calculi: Z87.442

## 2022-07-25 HISTORY — PX: CATARACT EXTRACTION W/PHACO: SHX586

## 2022-07-25 HISTORY — DX: Personal history of other diseases of the nervous system and sense organs: Z86.69

## 2022-07-25 HISTORY — DX: Barrett's esophagus without dysplasia: K22.70

## 2022-07-25 SURGERY — PHACOEMULSIFICATION, CATARACT, WITH IOL INSERTION
Anesthesia: Monitor Anesthesia Care | Site: Eye | Laterality: Left

## 2022-07-25 MED ORDER — SIGHTPATH DOSE#1 BSS IO SOLN
INTRAOCULAR | Status: DC | PRN
  Administered 2022-07-25: 15 mL via INTRAOCULAR

## 2022-07-25 MED ORDER — MOXIFLOXACIN HCL 0.5 % OP SOLN
OPHTHALMIC | Status: DC | PRN
  Administered 2022-07-25: .2 mL via OPHTHALMIC

## 2022-07-25 MED ORDER — FENTANYL CITRATE (PF) 100 MCG/2ML IJ SOLN
INTRAMUSCULAR | Status: DC | PRN
  Administered 2022-07-25 (×2): 50 ug via INTRAVENOUS

## 2022-07-25 MED ORDER — SIGHTPATH DOSE#1 BSS IO SOLN
INTRAOCULAR | Status: DC | PRN
  Administered 2022-07-25: 2 mL via INTRAMUSCULAR

## 2022-07-25 MED ORDER — SIGHTPATH DOSE#1 BSS IO SOLN
INTRAOCULAR | Status: DC | PRN
  Administered 2022-07-25: 46 mL via OPHTHALMIC

## 2022-07-25 MED ORDER — SODIUM CHLORIDE 0.9% FLUSH
INTRAVENOUS | Status: DC | PRN
  Administered 2022-07-25: 10 mL via INTRAVENOUS

## 2022-07-25 MED ORDER — TETRACAINE HCL 0.5 % OP SOLN
1.0000 [drp] | OPHTHALMIC | Status: DC | PRN
  Administered 2022-07-25 (×3): 1 [drp] via OPHTHALMIC

## 2022-07-25 MED ORDER — MIDAZOLAM HCL 2 MG/2ML IJ SOLN
INTRAMUSCULAR | Status: DC | PRN
  Administered 2022-07-25: 2 mg via INTRAVENOUS

## 2022-07-25 MED ORDER — BRIMONIDINE TARTRATE-TIMOLOL 0.2-0.5 % OP SOLN
OPHTHALMIC | Status: DC | PRN
  Administered 2022-07-25: 1 [drp] via OPHTHALMIC

## 2022-07-25 MED ORDER — SIGHTPATH DOSE#1 NA CHONDROIT SULF-NA HYALURON 40-17 MG/ML IO SOLN
INTRAOCULAR | Status: DC | PRN
  Administered 2022-07-25: 1 mL via INTRAOCULAR

## 2022-07-25 MED ORDER — LACTATED RINGERS IV SOLN: INTRAVENOUS | Status: DC

## 2022-07-25 MED ORDER — ARMC OPHTHALMIC DILATING DROPS
1.0000 | OPHTHALMIC | Status: DC | PRN
  Administered 2022-07-25 (×3): 1 via OPHTHALMIC

## 2022-07-25 SURGICAL SUPPLY — 13 items
ANGLE REVERSE CUT SHRT 25GA (CUTTER) ×1
CANNULA ANT/CHMB 27G (MISCELLANEOUS) IMPLANT
CANNULA ANT/CHMB 27GA (MISCELLANEOUS) IMPLANT
CATARACT SUITE SIGHTPATH (MISCELLANEOUS) ×1 IMPLANT
CYSTOTOME ANGL RVRS SHRT 25G (CUTTER) ×1 IMPLANT
CYSTOTOME ANGL RVRS SHRT 25GA (CUTTER) ×1 IMPLANT
FEE CATARACT SUITE SIGHTPATH (MISCELLANEOUS) ×1 IMPLANT
GLOVE BIOGEL PI IND STRL 8 (GLOVE) ×1 IMPLANT
GLOVE SURG ENC TEXT LTX SZ8 (GLOVE) ×1 IMPLANT
LENS IOL TECNIS EYHANCE 23.5 (Intraocular Lens) IMPLANT
NDL FILTER BLUNT 18X1 1/2 (NEEDLE) ×1 IMPLANT
NEEDLE FILTER BLUNT 18X1 1/2 (NEEDLE) ×1 IMPLANT
SYR 3ML LL SCALE MARK (SYRINGE) ×1 IMPLANT

## 2022-07-25 NOTE — H&P (Signed)
[facility]   Primary Care Physician:  Marisue Ivan, MD Ophthalmologist: Dr. Druscilla Brownie  Pre-Procedure History & Physical: HPI:  Nicole Velez is a 72 y.o. female here for cataract surgery.   Past Medical History:  Diagnosis Date   Barrett's esophagus without dysplasia    Chickenpox    Chronic kidney disease    GERD (gastroesophageal reflux disease)    Helicobacter pylori gastritis    Hiatal hernia    History of nephrolithiasis    History of tension headache    Migraines    Osteopenia     Past Surgical History:  Procedure Laterality Date   ABDOMINAL HYSTERECTOMY     APPENDECTOMY     BILATERAL SALPINGOOPHORECTOMY  11/1993   [facility]   CHOLECYSTECTOMY  2016   COLONOSCOPY     ESOPHAGOGASTRODUODENOSCOPY (EGD) WITH PROPOFOL N/A 08/18/2015   Procedure: ESOPHAGOGASTRODUODENOSCOPY (EGD) WITH PROPOFOL;  Surgeon: Christena Deem, MD;  Location: [facility] ENDOSCOPY;  Service: Endoscopy;  Laterality: N/A;   HERNIA REPAIR     INCISION AND DRAINAGE OF WOUND Right 12/05/2021   Procedure: IRRIGATION AND DEBRIDEMENT WOUND WITH FOREIGN BODY REMOVAL RIGHT INDEX FINGER;  Surgeon: Christena Flake, MD;  Location: [facility] ORS;  Service: Orthopedics;  Laterality: Right;   TONSILLECTOMY      Prior to Admission medications   Medication Sig Start Date End Date Taking? Authorizing Provider  GEMTESA 75 MG TABS Take 1 tablet (75 mg total) by mouth daily. 07/03/22  Yes Vanna Scotland, MD  Multiple Vitamin (MULTI-VITAMINS) TABS Take 1 tablet by mouth daily.    Yes [provider]  PREMARIN 0.625 MG tablet Take 0.625 mg by mouth daily.  04/26/14  Yes [provider]  RABEprazole (ACIPHEX) 20 MG tablet Take 1 tablet by mouth daily. 09/10/17  Yes [provider]    Allergies as of 07/11/2022   (No Known Allergies)    Family History  Problem Relation Age of Onset   Hypertension Father    Breast cancer Neg Hx     Social History   Socioeconomic History   Marital  status: Divorced    Spouse name: Not on file   Number of children: Not on file   Years of education: Not on file   Highest education level: Not on file  Occupational History   Not on file  Tobacco Use   Smoking status: Never   Smokeless tobacco: Never  Substance and Sexual Activity   Alcohol use: Yes    Alcohol/week: 0.0 standard drinks of alcohol    Comment: occasional   Drug use: No   Sexual activity: Not on file  Other Topics Concern   Not on file  Social History Narrative   Not on file   Social Determinants of Health   Financial Resource Strain: Not on file  Food Insecurity: Not on file  Transportation Needs: Not on file  Physical Activity: Not on file  Stress: Not on file  Social Connections: Not on file  Intimate Partner Violence: Not on file    Review of Systems: See HPI, otherwise negative ROS  Physical Exam: BP (!) 185/65   Pulse 74   Temp 97.9 F (36.6 C) (Temporal)   Resp 12   Ht 5\' 2"  (1.575 m)   Wt 68 kg   SpO2 97%   BMI 27.44 kg/m  General:   Alert, cooperative in NAD Head:  Normocephalic and atraumatic. Respiratory:  Normal work of breathing. Cardiovascular:  RRR  Impression/Plan: Nicole Velez is  here for cataract surgery.  Risks, benefits, limitations, and alternatives regarding cataract surgery have been reviewed with the patient.  Questions have been answered.  All parties agreeable.   Galen Manila, MD  07/25/2022, 9:18 AM

## 2022-07-25 NOTE — Anesthesia Postprocedure Evaluation (Signed)
Anesthesia Post Note  Patient: DEJANE SCHEIBE  Procedure(s) Performed: CATARACT EXTRACTION PHACO AND INTRAOCULAR LENS PLACEMENT (IOC) LEFT 4.81 00:34.6 (Left: Eye)  Patient location during evaluation: PACU Anesthesia Type: MAC Level of consciousness: awake and alert Pain management: pain level controlled Vital Signs Assessment: post-procedure vital signs reviewed and stable Respiratory status: spontaneous breathing, nonlabored ventilation, respiratory function stable and patient connected to nasal cannula oxygen Cardiovascular status: stable and blood pressure returned to baseline Postop Assessment: no apparent nausea or vomiting Anesthetic complications: no   No notable events documented.   Last Vitals:  Vitals:   07/25/22 0943 07/25/22 0949  BP: (!) 129/51 129/71  Pulse: 70 75  Resp: 12 16  Temp: (!) 36.1 C (!) 36.1 C  SpO2: 94% 95%    Last Pain:  Vitals:   07/25/22 0949  TempSrc:   PainSc: 0-No pain                 Marisue Humble

## 2022-07-25 NOTE — Op Note (Signed)
PREOPERATIVE DIAGNOSIS:  Nuclear sclerotic cataract of the left eye.   POSTOPERATIVE DIAGNOSIS:  Nuclear sclerotic cataract of the left eye.   OPERATIVE PROCEDURE:ORPROCALL@   SURGEON:  Galen Manila, MD.   ANESTHESIA:  Anesthesiologist: Marisue Humble, MD CRNA: Genia Del, CRNA  1.      Managed anesthesia care. 2.     0.67ml of Shugarcaine was instilled following the paracentesis   COMPLICATIONS:  None.   TECHNIQUE:   Stop and chop   DESCRIPTION OF PROCEDURE:  The patient was examined and consented in the preoperative holding area where the aforementioned topical anesthesia was applied to the left eye and then brought back to the Operating Room where the left eye was prepped and draped in the usual sterile ophthalmic fashion and a lid speculum was placed. A paracentesis was created with the side port blade and the anterior chamber was filled with viscoelastic. A near clear corneal incision was performed with the steel keratome. A continuous curvilinear capsulorrhexis was performed with a cystotome followed by the capsulorrhexis forceps. Hydrodissection and hydrodelineation were carried out with BSS on a blunt cannula. The lens was removed in a stop and chop  technique and the remaining cortical material was removed with the irrigation-aspiration handpiece. The capsular bag was inflated with viscoelastic and the Technis [id number] lens was placed in the capsular bag without complication. The remaining viscoelastic was removed from the eye with the irrigation-aspiration handpiece. The wounds were hydrated. The anterior chamber was flushed with BSS and the eye was inflated to physiologic pressure. 0.23ml Vigamox was placed in the anterior chamber. The wounds were found to be water tight. The eye was dressed with Combigan. The patient was given protective glasses to wear throughout the day and a shield with which to sleep tonight. The patient was also given drops with which to begin a drop regimen  today and will follow-up with me in one day. Implant Name Type Inv. Item Serial No. Manufacturer Lot No. LRB No. Used Action  LENS IOL TECNIS EYHANCE 23.5 - [id number] Intraocular Lens LENS IOL TECNIS EYHANCE 23.5 [id number] [employer]  Left 1 Implanted    Procedure(s): CATARACT EXTRACTION PHACO AND INTRAOCULAR LENS PLACEMENT (IOC) LEFT 4.81 00:34.6 (Left)  Electronically signed: Galen Manila 07/25/2022 9:42 AM

## 2022-07-25 NOTE — Transfer of Care (Signed)
Immediate Anesthesia Transfer of Care Note  Patient: Nicole Velez  Procedure(s) Performed: CATARACT EXTRACTION PHACO AND INTRAOCULAR LENS PLACEMENT (IOC) LEFT 4.81 00:34.6 (Left: Eye)  Patient Location: PACU  Anesthesia Type:MAC  Level of Consciousness: awake, alert , and oriented  Airway & Oxygen Therapy: Patient Spontanous Breathing  Post-op Assessment: Report given to RN and Post -op Vital signs reviewed and stable  Post vital signs: Reviewed and stable  Last Vitals: See PACU flow sheet VSS  Vitals Value Taken Time  BP    Temp    Pulse 65 07/25/22 0945  Resp 11 07/25/22 0945  SpO2 93 % 07/25/22 0945  Vitals shown include unvalidated device data.  Last Pain:  Vitals:   07/25/22 0845  TempSrc: Temporal  PainSc: 0-No pain         Complications: No notable events documented.

## 2022-07-25 NOTE — Anesthesia Preprocedure Evaluation (Addendum)
Anesthesia Evaluation  Patient identified by MRN, date of birth, ID band Patient awake    Reviewed: Allergy & Precautions, NPO status , Patient's Chart, lab work & pertinent test results  History of Anesthesia Complications Negative for: history of anesthetic complications  Airway Mallampati: I   Neck ROM: Full    Dental no notable dental hx.    Pulmonary neg pulmonary ROS   Pulmonary exam normal breath sounds clear to auscultation       Cardiovascular Exercise Tolerance: Good negative cardio ROS Normal cardiovascular exam Rhythm:Regular Rate:Normal     Neuro/Psych  Headaches    GI/Hepatic hiatal hernia,GERD (Barrett esophagus)  ,,  Endo/Other  negative endocrine ROS    Renal/GU Renal disease (CKD, nephrolithiasis)     Musculoskeletal   Abdominal   Peds  Hematology negative hematology ROS (+)   Anesthesia Other Findings   Reproductive/Obstetrics                             Anesthesia Physical Anesthesia Plan  ASA: 2  Anesthesia Plan: MAC   Post-op Pain Management:    Induction: Intravenous  PONV Risk Score and Plan: 2 and Treatment may vary due to age or medical condition, Midazolam and TIVA  Airway Management Planned: Natural Airway and Nasal Cannula  Additional Equipment:   Intra-op Plan:   Post-operative Plan:   Informed Consent: I have reviewed the patients History and Physical, chart, labs and discussed the procedure including the risks, benefits and alternatives for the proposed anesthesia with the patient or authorized representative who has indicated his/her understanding and acceptance.     Dental advisory given  Plan Discussed with: CRNA  Anesthesia Plan Comments: (LMA/GETA backup discussed.  Patient consented for risks of anesthesia including but not limited to:  - adverse reactions to medications - damage to eyes, teeth, lips or other oral mucosa - nerve  damage due to positioning  - sore throat or hoarseness - damage to heart, brain, nerves, lungs, other parts of body or loss of life  Informed patient about role of CRNA in peri- and intra-operative care.  Patient voiced understanding.)       Anesthesia Quick Evaluation

## 2022-07-26 ENCOUNTER — Encounter: Payer: Self-pay | Admitting: Ophthalmology

## 2022-07-28 ENCOUNTER — Encounter: Payer: Self-pay | Admitting: *Deleted

## 2022-07-31 ENCOUNTER — Ambulatory Visit
Admission: RE | Admit: 2022-07-31 | Discharge: 2022-07-31 | Disposition: A | Discharge status: Home / Self Care | Payer: 59 | Attending: Gastroenterology | Admitting: Gastroenterology

## 2022-07-31 ENCOUNTER — Encounter
Admission: RE | Disposition: A | Discharge status: Home / Self Care | Payer: Self-pay | Source: Home / Self Care | Attending: Gastroenterology

## 2022-07-31 ENCOUNTER — Ambulatory Visit: Payer: 59 | Admitting: General Practice

## 2022-07-31 DIAGNOSIS — N189 Chronic kidney disease, unspecified: Secondary | ICD-10-CM | POA: Insufficient documentation

## 2022-07-31 DIAGNOSIS — K449 Diaphragmatic hernia without obstruction or gangrene: Secondary | ICD-10-CM | POA: Diagnosis not present

## 2022-07-31 DIAGNOSIS — K219 Gastro-esophageal reflux disease without esophagitis: Secondary | ICD-10-CM | POA: Diagnosis not present

## 2022-07-31 DIAGNOSIS — M8580 Other specified disorders of bone density and structure, unspecified site: Secondary | ICD-10-CM | POA: Insufficient documentation

## 2022-07-31 HISTORY — PX: ESOPHAGOGASTRODUODENOSCOPY (EGD) WITH PROPOFOL: SHX5813

## 2022-07-31 SURGERY — ESOPHAGOGASTRODUODENOSCOPY (EGD) WITH PROPOFOL
Anesthesia: General

## 2022-07-31 MED ORDER — PROPOFOL 10 MG/ML IV BOLUS
INTRAVENOUS | Status: DC | PRN
Start: 2022-07-31 — End: 2022-07-31
  Administered 2022-07-31: 80 mg via INTRAVENOUS
  Administered 2022-07-31: 30 mg via INTRAVENOUS

## 2022-07-31 MED ORDER — GLYCOPYRROLATE 0.2 MG/ML IJ SOLN
INTRAMUSCULAR | Status: DC | PRN
  Administered 2022-07-31: .2 mg via INTRAVENOUS

## 2022-07-31 MED ORDER — GLYCOPYRROLATE 0.2 MG/ML IJ SOLN
INTRAMUSCULAR | Status: AC
  Filled 2022-07-31: qty 1

## 2022-07-31 MED ORDER — PROPOFOL 10 MG/ML IV BOLUS
INTRAVENOUS | Status: AC
  Filled 2022-07-31: qty 20

## 2022-07-31 MED ORDER — LACTATED RINGERS IV SOLN: INTRAVENOUS | Status: DC

## 2022-07-31 MED ORDER — LIDOCAINE HCL (CARDIAC) PF 100 MG/5ML IV SOSY
PREFILLED_SYRINGE | INTRAVENOUS | Status: DC | PRN
  Administered 2022-07-31: 80 mg via INTRAVENOUS

## 2022-07-31 MED ORDER — LIDOCAINE HCL (PF) 2 % IJ SOLN
INTRAMUSCULAR | Status: AC
  Filled 2022-07-31: qty 5

## 2022-07-31 MED ORDER — SODIUM CHLORIDE 0.9 % IV SOLN
INTRAVENOUS | Status: DC
  Administered 2022-07-31: 20 mL/h via INTRAVENOUS

## 2022-07-31 NOTE — H&P (Signed)
Outpatient short stay form Pre-procedure 07/31/2022  Regis Bill, MD  Primary Physician: Marisue Ivan, MD  Reason for visit:  History of GERD  History of present illness:    72 y/o lady with GERD and possible BE here for EGD. No blood thinners. No family history of GI malignancies. No abdominal surgeries. No new GI symptoms.    Current Facility-Administered Medications:    0.9 %  sodium chloride infusion, , Intravenous, Continuous, Analia Zuk, Rossie Muskrat, MD, Last Rate: 20 mL/hr at 07/31/22 0934, 20 mL/hr at 07/31/22 0934   lactated ringers infusion, , Intravenous, Continuous, Pelham, Bevely Palmer, MD  Medications Prior to Admission  Medication Sig Dispense Refill Last Dose   GEMTESA 75 MG TABS Take 1 tablet (75 mg total) by mouth daily. 90 tablet 3 07/30/2022   Multiple Vitamin (MULTI-VITAMINS) TABS Take 1 tablet by mouth daily.    07/30/2022   PREMARIN 0.625 MG tablet Take 0.625 mg by mouth daily.   3 07/30/2022   RABEprazole (ACIPHEX) 20 MG tablet Take 1 tablet by mouth daily.  3 07/30/2022     No Known Allergies   Past Medical History:  Diagnosis Date   Barrett's esophagus without dysplasia    Chickenpox    Chronic kidney disease    GERD (gastroesophageal reflux disease)    Helicobacter pylori gastritis    Hiatal hernia    History of nephrolithiasis    History of tension headache    Migraines    Osteopenia     Review of systems:  Otherwise negative.    Physical Exam  Gen: Alert, oriented. Appears stated age.  HEENT: PERRLA. Lungs: No respiratory distress CV: RRR Abd: soft, benign, no masses Ext: No edema    Planned procedures: Proceed with EGD. The patient understands the nature of the planned procedure, indications, risks, alternatives and potential complications including but not limited to bleeding, infection, perforation, damage to internal organs and possible oversedation/side effects from anesthesia. The patient agrees and gives consent to proceed.   Please refer to procedure notes for findings, recommendations and patient disposition/instructions.     Regis Bill, MD [facility] Gastroenterology

## 2022-07-31 NOTE — Interval H&P Note (Signed)
History and Physical Interval Note:  07/31/2022 10:21 AM  Nicole Velez  has presented today for surgery, with the diagnosis of Barrett's esophagus.  The various methods of treatment have been discussed with the patient and family. After consideration of risks, benefits and other options for treatment, the patient has consented to  Procedure(s): ESOPHAGOGASTRODUODENOSCOPY (EGD) WITH PROPOFOL (N/A) as a surgical intervention.  The patient's history has been reviewed, patient examined, no change in status, stable for surgery.  I have reviewed the patient's chart and labs.  Questions were answered to the patient's satisfaction.     Regis Bill  Ok to proceed with EGD

## 2022-07-31 NOTE — Transfer of Care (Signed)
Immediate Anesthesia Transfer of Care Note  Patient: LACARA DUNSWORTH  Procedure(s) Performed: ESOPHAGOGASTRODUODENOSCOPY (EGD) WITH PROPOFOL  Patient Location: PACU  Anesthesia Type:General  Level of Consciousness: awake, alert , and oriented  Airway & Oxygen Therapy: Patient Spontanous Breathing  Post-op Assessment: Report given to RN and Post -op Vital signs reviewed and stable  Post vital signs: Reviewed and stable  Last Vitals:  Vitals Value Taken Time  BP 120/54 07/31/22 1041  Temp 35.8 C 07/31/22 1041  Pulse 97 07/31/22 1042  Resp 19 07/31/22 1042  SpO2 93 % 07/31/22 1042  Vitals shown include unfiled device data.  Last Pain:  Vitals:   07/31/22 1041  TempSrc: Tympanic  PainSc: Asleep         Complications: No notable events documented.

## 2022-07-31 NOTE — Anesthesia Postprocedure Evaluation (Signed)
Anesthesia Post Note  Patient: Nicole Velez  Procedure(s) Performed: ESOPHAGOGASTRODUODENOSCOPY (EGD) WITH PROPOFOL  Patient location during evaluation: Endoscopy Anesthesia Type: General Level of consciousness: awake and alert Pain management: pain level controlled Vital Signs Assessment: post-procedure vital signs reviewed and stable Respiratory status: spontaneous breathing, nonlabored ventilation, respiratory function stable and patient connected to nasal cannula oxygen Cardiovascular status: blood pressure returned to baseline and stable Postop Assessment: no apparent nausea or vomiting Anesthetic complications: no  No notable events documented.   Last Vitals:  Vitals:   07/31/22 1051 07/31/22 1101  BP: 130/68 (!) 141/74  Pulse: (!) 101 82  Resp: 20 18  Temp:    SpO2: 97% 95%    Last Pain:  Vitals:   07/31/22 1101  TempSrc:   PainSc: 0-No pain                 Stephanie Coup

## 2022-07-31 NOTE — Op Note (Signed)
[facility] Gastroenterology Patient Name: Nicole Velez Procedure Date: 07/31/2022 10:27 AM MRN: [id number] Account #: [id number] Date of Birth: 31-Dec-1950 Admit Type: Outpatient Age: 72 Room: [facility] ENDO ROOM 3 Gender: Female Note Status: Finalized Instrument Name: Upper Endoscope [id number] Procedure:             Upper GI endoscopy Indications:           Gastro-esophageal reflux disease, Suspected Barrett's                         esophagus Providers:             Eather Colas MD, MD Referring MD:          Marisue Ivan (Referring MD) Medicines:             Monitored Anesthesia Care Complications:         No immediate complications. Procedure:             Pre-Anesthesia Assessment:                        - Prior to the procedure, a History and Physical was                         performed, and patient medications and allergies were                         reviewed. The patient is competent. The risks and                         benefits of the procedure and the sedation options and                         risks were discussed with the patient. All questions                         were answered and informed consent was obtained.                         Patient identification and proposed procedure were                         verified by the physician, the nurse, the                         anesthesiologist, the anesthetist and the technician                         in the endoscopy suite. Mental Status Examination:                         alert and oriented. Airway Examination: normal                         oropharyngeal airway and neck mobility. Respiratory                         Examination: clear to auscultation. CV Examination:  normal. Prophylactic Antibiotics: The patient does not                         require prophylactic antibiotics. Prior                         Anticoagulants: The patient has taken no anticoagulant                          or antiplatelet agents. ASA Grade Assessment: II - A                         patient with mild systemic disease. After reviewing                         the risks and benefits, the patient was deemed in                         satisfactory condition to undergo the procedure. The                         anesthesia plan was to use monitored anesthesia care                         (MAC). Immediately prior to administration of                         medications, the patient was re-assessed for adequacy                         to receive sedatives. The heart rate, respiratory                         rate, oxygen saturations, blood pressure, adequacy of                         pulmonary ventilation, and response to care were                         monitored throughout the procedure. The physical                         status of the patient was re-assessed after the                         procedure.                        After obtaining informed consent, the endoscope was                         passed under direct vision. Throughout the procedure,                         the patient's blood pressure, pulse, and oxygen                         saturations were monitored continuously. The Endoscope  was introduced through the mouth, and advanced to the                         second part of duodenum. The upper GI endoscopy was                         accomplished without difficulty. The patient tolerated                         the procedure well. Findings:      The esophagus and gastroesophageal junction were examined with white       light and narrow band imaging (NBI). There was no visual evidence of       Barrett's esophagus.      A small hiatal hernia was present.      The entire examined stomach was normal.      The examined duodenum was normal. Impression:            - There is no endoscopic evidence of Barrett's                          esophagus.                        - Small hiatal hernia.                        - Normal stomach.                        - Normal examined duodenum.                        - No specimens collected. Recommendation:        - Discharge patient to home.                        - Resume previous diet.                        - Continue present medications.                        - Return to referring physician as previously                         scheduled. Procedure Code(s):     --- Professional ---                        [phone], Esophagogastroduodenoscopy, flexible,                         transoral; diagnostic, including collection of                         specimen(s) by brushing or washing, when performed                         (separate procedure) Diagnosis Code(s):     --- Professional ---  K44.9, Diaphragmatic hernia without obstruction or                         gangrene                        K21.9, Gastro-esophageal reflux disease without                         esophagitis CPT copyright 2022 American Medical Association. All rights reserved. The codes documented in this report are preliminary and upon coder review may  be revised to meet current compliance requirements. Eather Colas MD, MD 07/31/2022 10:43:32 AM Number of Addenda: 0 Note Initiated On: 07/31/2022 10:27 AM Estimated Blood Loss:  Estimated blood loss: none.      [facility]

## 2022-07-31 NOTE — Anesthesia Preprocedure Evaluation (Addendum)
Anesthesia Evaluation  Patient identified by MRN, date of birth, ID band Patient awake    Reviewed: Allergy & Precautions, NPO status , Patient's Chart, lab work & pertinent test results  History of Anesthesia Complications Negative for: history of anesthetic complications  Airway Mallampati: II  TM Distance: >3 FB Neck ROM: Full    Dental no notable dental hx. (+) Chipped   Pulmonary neg pulmonary ROS   Pulmonary exam normal breath sounds clear to auscultation       Cardiovascular Exercise Tolerance: Good negative cardio ROS Normal cardiovascular exam Rhythm:Regular Rate:Normal     Neuro/Psych  Headaches negative neurological ROS  negative psych ROS   GI/Hepatic negative GI ROS, Neg liver ROS, hiatal hernia,GERD  ,,  Endo/Other  negative endocrine ROS    Renal/GU Renal disease (CKD)negative Renal ROS  negative genitourinary   Musculoskeletal   Abdominal   Peds  Hematology negative hematology ROS (+)   Anesthesia Other Findings Chronic kidney disease  Hiatal hernia GERD (gastroesophageal reflux disease) Helicobacter pylori gastritis  Migraines Osteopenia  Barrett's esophagus without dysplasia History of tension headache  History of nephrolithiasis      Reproductive/Obstetrics negative OB ROS                             Anesthesia Physical Anesthesia Plan  ASA: 2  Anesthesia Plan: General   Post-op Pain Management: Minimal or no pain anticipated   Induction: Intravenous  PONV Risk Score and Plan: 3 and Propofol infusion, TIVA and Ondansetron  Airway Management Planned: Nasal Cannula  Additional Equipment: None  Intra-op Plan:   Post-operative Plan: Extubation in OR  Informed Consent: I have reviewed the patients History and Physical, chart, labs and discussed the procedure including the risks, benefits and alternatives for the proposed anesthesia with the patient  or authorized representative who has indicated his/her understanding and acceptance.     Dental advisory given  Plan Discussed with: CRNA and Surgeon  Anesthesia Plan Comments: (Discussed risks of anesthesia with patient, including possibility of difficulty with spontaneous ventilation under anesthesia necessitating airway intervention, PONV, and rare risks such as cardiac or respiratory or neurological events, and allergic reactions. Discussed the role of CRNA in patient's perioperative care. Patient understands.)       Anesthesia Quick Evaluation

## 2022-08-01 ENCOUNTER — Encounter: Payer: Self-pay | Admitting: Gastroenterology

## 2022-08-03 ENCOUNTER — Ambulatory Visit
Admission: RE | Admit: 2022-08-03 | Discharge: 2022-08-03 | Disposition: A | Discharge status: Home / Self Care | Payer: 59 | Source: Ambulatory Visit | Attending: Obstetrics and Gynecology | Admitting: Obstetrics and Gynecology

## 2022-08-03 DIAGNOSIS — Z1231 Encounter for screening mammogram for malignant neoplasm of breast: Secondary | ICD-10-CM | POA: Insufficient documentation

## 2022-08-04 NOTE — Discharge Instructions (Signed)

## 2022-08-08 ENCOUNTER — Ambulatory Visit: Payer: Self-pay | Admitting: Anesthesiology

## 2022-08-08 ENCOUNTER — Other Ambulatory Visit: Payer: Self-pay

## 2022-08-08 ENCOUNTER — Ambulatory Visit
Admission: RE | Admit: 2022-08-08 | Discharge: 2022-08-08 | Disposition: A | Discharge status: Home / Self Care | Payer: 59 | Attending: Ophthalmology | Admitting: Ophthalmology

## 2022-08-08 ENCOUNTER — Ambulatory Visit: Payer: 59 | Admitting: Anesthesiology

## 2022-08-08 ENCOUNTER — Encounter
Admission: RE | Disposition: A | Discharge status: Home / Self Care | Payer: Self-pay | Source: Home / Self Care | Attending: Ophthalmology

## 2022-08-08 DIAGNOSIS — H2511 Age-related nuclear cataract, right eye: Secondary | ICD-10-CM | POA: Diagnosis present

## 2022-08-08 DIAGNOSIS — K219 Gastro-esophageal reflux disease without esophagitis: Secondary | ICD-10-CM | POA: Insufficient documentation

## 2022-08-08 DIAGNOSIS — N189 Chronic kidney disease, unspecified: Secondary | ICD-10-CM | POA: Diagnosis not present

## 2022-08-08 HISTORY — PX: CATARACT EXTRACTION W/PHACO: SHX586

## 2022-08-08 SURGERY — PHACOEMULSIFICATION, CATARACT, WITH IOL INSERTION
Anesthesia: Monitor Anesthesia Care | Laterality: Right

## 2022-08-08 MED ORDER — SIGHTPATH DOSE#1 BSS IO SOLN
INTRAOCULAR | Status: DC | PRN
  Administered 2022-08-08: 45 mL via OPHTHALMIC

## 2022-08-08 MED ORDER — MIDAZOLAM HCL 2 MG/2ML IJ SOLN
INTRAMUSCULAR | Status: DC | PRN
  Administered 2022-08-08: 2 mg via INTRAVENOUS

## 2022-08-08 MED ORDER — TETRACAINE HCL 0.5 % OP SOLN
1.0000 [drp] | OPHTHALMIC | Status: DC | PRN
  Administered 2022-08-08 (×3): 1 [drp] via OPHTHALMIC

## 2022-08-08 MED ORDER — SIGHTPATH DOSE#1 NA CHONDROIT SULF-NA HYALURON 40-17 MG/ML IO SOLN
INTRAOCULAR | Status: DC | PRN
  Administered 2022-08-08: 1 mL via INTRAOCULAR

## 2022-08-08 MED ORDER — MOXIFLOXACIN HCL 0.5 % OP SOLN
OPHTHALMIC | Status: DC | PRN
  Administered 2022-08-08: .2 mL via OPHTHALMIC

## 2022-08-08 MED ORDER — FENTANYL CITRATE (PF) 100 MCG/2ML IJ SOLN
INTRAMUSCULAR | Status: DC | PRN
  Administered 2022-08-08 (×2): 50 ug via INTRAVENOUS

## 2022-08-08 MED ORDER — SIGHTPATH DOSE#1 BSS IO SOLN
INTRAOCULAR | Status: DC | PRN
  Administered 2022-08-08: 2 mL

## 2022-08-08 MED ORDER — SIGHTPATH DOSE#1 BSS IO SOLN
INTRAOCULAR | Status: DC | PRN
  Administered 2022-08-08: 15 mL via INTRAOCULAR

## 2022-08-08 MED ORDER — ARMC OPHTHALMIC DILATING DROPS
1.0000 | OPHTHALMIC | Status: DC | PRN
  Administered 2022-08-08 (×3): 1 via OPHTHALMIC

## 2022-08-08 MED ORDER — BRIMONIDINE TARTRATE-TIMOLOL 0.2-0.5 % OP SOLN
OPHTHALMIC | Status: DC | PRN
  Administered 2022-08-08: 1 [drp] via OPHTHALMIC

## 2022-08-08 SURGICAL SUPPLY — 11 items
ANGLE REVERSE CUT SHRT 25GA (CUTTER) ×1
CATARACT SUITE SIGHTPATH (MISCELLANEOUS) ×1 IMPLANT
CYSTOTOME ANGL RVRS SHRT 25G (CUTTER) ×1 IMPLANT
CYSTOTOME ANGL RVRS SHRT 25GA (CUTTER) ×1 IMPLANT
FEE CATARACT SUITE SIGHTPATH (MISCELLANEOUS) ×1 IMPLANT
GLOVE BIOGEL PI IND STRL 8 (GLOVE) ×1 IMPLANT
GLOVE SURG ENC TEXT LTX SZ8 (GLOVE) ×1 IMPLANT
LENS IOL TECNIS EYHANCE 23.0 (Intraocular Lens) IMPLANT
NDL FILTER BLUNT 18X1 1/2 (NEEDLE) ×1 IMPLANT
NEEDLE FILTER BLUNT 18X1 1/2 (NEEDLE) ×1 IMPLANT
SYR 3ML LL SCALE MARK (SYRINGE) ×1 IMPLANT

## 2022-08-08 NOTE — H&P (Signed)
[facility]   Primary Care Physician:  Marisue Ivan, MD Ophthalmologist: Dr. Druscilla Brownie  Pre-Procedure History & Physical: HPI:  Nicole Velez is a 72 y.o. female here for cataract surgery.   Past Medical History:  Diagnosis Date   Barrett's esophagus without dysplasia    Chickenpox    Chronic kidney disease    GERD (gastroesophageal reflux disease)    Helicobacter pylori gastritis    Hiatal hernia    History of nephrolithiasis    History of tension headache    Migraines    Osteopenia     Past Surgical History:  Procedure Laterality Date   ABDOMINAL HYSTERECTOMY     APPENDECTOMY     BILATERAL SALPINGOOPHORECTOMY  11/1993   [facility]   CATARACT EXTRACTION W/PHACO Left 07/25/2022   Procedure: CATARACT EXTRACTION PHACO AND INTRAOCULAR LENS PLACEMENT (IOC) LEFT 4.81 00:34.6;  Surgeon: Galen Manila, MD;  Location: [facility];  Service: Ophthalmology;  Laterality: Left;   CHOLECYSTECTOMY  2016   COLONOSCOPY     ESOPHAGOGASTRODUODENOSCOPY (EGD) WITH PROPOFOL N/A 08/18/2015   Procedure: ESOPHAGOGASTRODUODENOSCOPY (EGD) WITH PROPOFOL;  Surgeon: Christena Deem, MD;  Location: [facility] ENDOSCOPY;  Service: Endoscopy;  Laterality: N/A;   ESOPHAGOGASTRODUODENOSCOPY (EGD) WITH PROPOFOL N/A 07/31/2022   Procedure: ESOPHAGOGASTRODUODENOSCOPY (EGD) WITH PROPOFOL;  Surgeon: Regis Bill, MD;  Location: [facility] ENDOSCOPY;  Service: Endoscopy;  Laterality: N/A;   EYE SURGERY     HERNIA REPAIR     INCISION AND DRAINAGE OF WOUND Right 12/05/2021   Procedure: IRRIGATION AND DEBRIDEMENT WOUND WITH FOREIGN BODY REMOVAL RIGHT INDEX FINGER;  Surgeon: Christena Flake, MD;  Location: [facility] ORS;  Service: Orthopedics;  Laterality: Right;   TONSILLECTOMY      Prior to Admission medications   Medication Sig Start Date End Date Taking? Authorizing Provider  GEMTESA 75 MG TABS Take 1 tablet (75 mg total) by mouth daily. 07/03/22  Yes Vanna Scotland, MD  Multiple Vitamin  (MULTI-VITAMINS) TABS Take 1 tablet by mouth daily.    Yes [provider]  PREMARIN 0.625 MG tablet Take 0.625 mg by mouth daily.  04/26/14  Yes [provider]  RABEprazole (ACIPHEX) 20 MG tablet Take 1 tablet by mouth daily. 09/10/17  Yes [provider]    Allergies as of 07/11/2022   (No Known Allergies)    Family History  Problem Relation Age of Onset   Hypertension Father    Breast cancer Neg Hx     Social History   Socioeconomic History   Marital status: Divorced    Spouse name: Not on file   Number of children: Not on file   Years of education: Not on file   Highest education level: Not on file  Occupational History   Not on file  Tobacco Use   Smoking status: Never   Smokeless tobacco: Never  Vaping Use   Vaping status: Never Used  Substance and Sexual Activity   Alcohol use: Yes    Alcohol/week: 0.0 standard drinks of alcohol    Comment: occasional   Drug use: No   Sexual activity: Not on file  Other Topics Concern   Not on file  Social History Narrative   Not on file   Social Determinants of Health   Financial Resource Strain: Not on file  Food Insecurity: Not on file  Transportation Needs: Not on file  Physical Activity: Not on file  Stress: Not on file  Social Connections: Not on file  Intimate Partner Violence: Not on  file    Review of Systems: See HPI, otherwise negative ROS  Physical Exam: BP (!) 169/66   Pulse 70   Temp (!) 97.2 F (36.2 C) (Temporal)   Resp 16   Ht 5\' 2"  (1.575 m)   Wt 66.7 kg   SpO2 97%   BMI 26.89 kg/m  General:   Alert, cooperative in NAD Head:  Normocephalic and atraumatic. Respiratory:  Normal work of breathing. Cardiovascular:  RRR  Impression/Plan: Nicole Velez is here for cataract surgery.  Risks, benefits, limitations, and alternatives regarding cataract surgery have been reviewed with the patient.  Questions have been answered.  All parties agreeable.   Galen Manila, MD  08/08/2022, 11:13 AM

## 2022-08-08 NOTE — Transfer of Care (Signed)
Immediate Anesthesia Transfer of Care Note  Patient: Nicole Velez  Procedure(s) Performed: CATARACT EXTRACTION PHACO AND INTRAOCULAR LENS PLACEMENT (IOC) RIGHT 2.98 00:21.9 (Right)  Patient Location: PACU  Anesthesia Type: MAC  Level of Consciousness: awake, alert  and patient cooperative  Airway and Oxygen Therapy: Patient Spontanous Breathing and Patient connected to supplemental oxygen  Post-op Assessment: Post-op Vital signs reviewed, Patient's Cardiovascular Status Stable, Respiratory Function Stable, Patent Airway and No signs of Nausea or vomiting  Post-op Vital Signs: Reviewed and stable  Complications: No notable events documented.

## 2022-08-08 NOTE — Anesthesia Postprocedure Evaluation (Signed)
Anesthesia Post Note  Patient: Nicole Velez  Procedure(s) Performed: CATARACT EXTRACTION PHACO AND INTRAOCULAR LENS PLACEMENT (IOC) RIGHT 2.98 00:21.9 (Right)  Patient location during evaluation: PACU Anesthesia Type: MAC Level of consciousness: awake and alert, oriented and patient cooperative Pain management: pain level controlled Vital Signs Assessment: post-procedure vital signs reviewed and stable Respiratory status: spontaneous breathing, nonlabored ventilation and respiratory function stable Cardiovascular status: blood pressure returned to baseline and stable Postop Assessment: adequate PO intake Anesthetic complications: no   No notable events documented.   Last Vitals:  Vitals:   08/08/22 1140 08/08/22 1146  BP: 134/69 126/80  Pulse: 65 74  Resp: 12 (!) 22  Temp: 36.8 C   SpO2: 97% 98%    Last Pain:  Vitals:   08/08/22 1146  TempSrc:   PainSc: 0-No pain                 Reed Breech

## 2022-08-08 NOTE — Op Note (Signed)
PREOPERATIVE DIAGNOSIS:  Nuclear sclerotic cataract of the right eye.   POSTOPERATIVE DIAGNOSIS:  1 Cataract   OPERATIVE PROCEDURE:ORPROCALL@   SURGEON:  Galen Manila, MD.   ANESTHESIA:  Anesthesiologist: Reed Breech, MD CRNA: Domenic Moras, CRNA  1.      Managed anesthesia care. 2.      0.35ml of Shugarcaine was instilled in the eye following the paracentesis.   COMPLICATIONS:  None.   TECHNIQUE:   Stop and chop   DESCRIPTION OF PROCEDURE:  The patient was examined and consented in the preoperative holding area where the aforementioned topical anesthesia was applied to the right eye and then brought back to the Operating Room where the right eye was prepped and draped in the usual sterile ophthalmic fashion and a lid speculum was placed. A paracentesis was created with the side port blade and the anterior chamber was filled with viscoelastic. A near clear corneal incision was performed with the steel keratome. A continuous curvilinear capsulorrhexis was performed with a cystotome followed by the capsulorrhexis forceps. Hydrodissection and hydrodelineation were carried out with BSS on a blunt cannula. The lens was removed in a stop and chop  technique and the remaining cortical material was removed with the irrigation-aspiration handpiece. The capsular bag was inflated with viscoelastic and the Technis [id number]  lens was placed in the capsular bag without complication. The remaining viscoelastic was removed from the eye with the irrigation-aspiration handpiece. The wounds were hydrated. The anterior chamber was flushed with BSS and the eye was inflated to physiologic pressure. 0.43ml of Vigamox was placed in the anterior chamber. The wounds were found to be water tight. The eye was dressed with Combigan. The patient was given protective glasses to wear throughout the day and a shield with which to sleep tonight. The patient was also given drops with which to begin a drop regimen today and  will follow-up with me in one day. Implant Name Type Inv. Item Serial No. Manufacturer Lot No. LRB No. Used Action  LENS IOL TECNIS EYHANCE 23.0 - [id number] Intraocular Lens LENS IOL TECNIS EYHANCE 23.0 [id number] [employer]  Right 1 Implanted   Procedure(s): CATARACT EXTRACTION PHACO AND INTRAOCULAR LENS PLACEMENT (IOC) RIGHT 2.98 00:21.9 (Right)  Electronically signed: Galen Manila 08/08/2022 11:38 AM

## 2022-08-09 ENCOUNTER — Encounter: Payer: Self-pay | Admitting: Ophthalmology

## 2023-05-09 ENCOUNTER — Other Ambulatory Visit: Payer: Self-pay | Admitting: Urology

## 2023-05-30 ENCOUNTER — Ambulatory Visit: Payer: Self-pay | Admitting: Urology

## 2023-06-18 ENCOUNTER — Ambulatory Visit: Admit: 2023-06-18 | Payer: 59

## 2023-06-18 SURGERY — COLONOSCOPY WITH PROPOFOL
Anesthesia: General

## 2023-06-21 ENCOUNTER — Encounter: Payer: Self-pay | Admitting: *Deleted

## 2023-07-09 ENCOUNTER — Encounter
Admission: RE | Disposition: A | Discharge status: Home / Self Care | Payer: Self-pay | Source: Home / Self Care | Attending: Gastroenterology

## 2023-07-09 ENCOUNTER — Ambulatory Visit
Admission: RE | Admit: 2023-07-09 | Discharge: 2023-07-09 | Disposition: A | Discharge status: Home / Self Care | Attending: Gastroenterology | Admitting: Gastroenterology

## 2023-07-09 ENCOUNTER — Ambulatory Visit: Admitting: Anesthesiology

## 2023-07-09 ENCOUNTER — Other Ambulatory Visit: Payer: Self-pay

## 2023-07-09 DIAGNOSIS — N189 Chronic kidney disease, unspecified: Secondary | ICD-10-CM | POA: Diagnosis not present

## 2023-07-09 DIAGNOSIS — Z90710 Acquired absence of both cervix and uterus: Secondary | ICD-10-CM | POA: Diagnosis not present

## 2023-07-09 DIAGNOSIS — M1990 Unspecified osteoarthritis, unspecified site: Secondary | ICD-10-CM | POA: Insufficient documentation

## 2023-07-09 DIAGNOSIS — Z1211 Encounter for screening for malignant neoplasm of colon: Secondary | ICD-10-CM | POA: Diagnosis present

## 2023-07-09 DIAGNOSIS — Z9049 Acquired absence of other specified parts of digestive tract: Secondary | ICD-10-CM | POA: Diagnosis not present

## 2023-07-09 DIAGNOSIS — K5730 Diverticulosis of large intestine without perforation or abscess without bleeding: Secondary | ICD-10-CM | POA: Insufficient documentation

## 2023-07-09 DIAGNOSIS — K640 First degree hemorrhoids: Secondary | ICD-10-CM | POA: Insufficient documentation

## 2023-07-09 DIAGNOSIS — M8580 Other specified disorders of bone density and structure, unspecified site: Secondary | ICD-10-CM | POA: Diagnosis not present

## 2023-07-09 DIAGNOSIS — D120 Benign neoplasm of cecum: Secondary | ICD-10-CM | POA: Diagnosis not present

## 2023-07-09 DIAGNOSIS — K219 Gastro-esophageal reflux disease without esophagitis: Secondary | ICD-10-CM | POA: Diagnosis not present

## 2023-07-09 HISTORY — PX: COLONOSCOPY: SHX5424

## 2023-07-09 HISTORY — DX: Radiculopathy, lumbar region: M54.16

## 2023-07-09 HISTORY — PX: POLYPECTOMY: SHX149

## 2023-07-09 HISTORY — DX: Primary insomnia: F51.01

## 2023-07-09 HISTORY — DX: Overactive bladder: N32.81

## 2023-07-09 SURGERY — COLONOSCOPY
Anesthesia: General

## 2023-07-09 MED ORDER — PROPOFOL 1000 MG/100ML IV EMUL
INTRAVENOUS | Status: AC
  Filled 2023-07-09: qty 100

## 2023-07-09 MED ORDER — LIDOCAINE HCL (CARDIAC) PF 100 MG/5ML IV SOSY
PREFILLED_SYRINGE | INTRAVENOUS | Status: DC | PRN
  Administered 2023-07-09: 50 mg via INTRAVENOUS

## 2023-07-09 MED ORDER — PROPOFOL 500 MG/50ML IV EMUL
INTRAVENOUS | Status: DC | PRN
  Administered 2023-07-09: 150 ug/kg/min via INTRAVENOUS
  Administered 2023-07-09: 50 mg via INTRAVENOUS

## 2023-07-09 MED ORDER — SODIUM CHLORIDE 0.9 % IV SOLN
INTRAVENOUS | Status: DC
Start: 2023-07-09 — End: 2023-07-09

## 2023-07-09 MED ORDER — LIDOCAINE HCL (PF) 2 % IJ SOLN
INTRAMUSCULAR | Status: AC
Start: 2023-07-09 — End: 2023-07-09
  Filled 2023-07-09: qty 40

## 2023-07-09 NOTE — Interval H&P Note (Signed)
 History and Physical Interval Note:  07/09/2023 7:43 AM  Nicole Velez  has presented today for surgery, with the diagnosis of History of adenomatous polyp of colon (Z86.0101.  The various methods of treatment have been discussed with the patient and family. After consideration of risks, benefits and other options for treatment, the patient has consented to  Procedure(s): COLONOSCOPY (N/A) as a surgical intervention.  The patient's history has been reviewed, patient examined, no change in status, stable for surgery.  I have reviewed the patient's chart and labs.  Questions were answered to the patient's satisfaction.     [name]  Ok to proceed with colonoscopy

## 2023-07-09 NOTE — Anesthesia Postprocedure Evaluation (Signed)
 Anesthesia Post Note  Patient: Nicole Velez  Procedure(s) Performed: COLONOSCOPY POLYPECTOMY, INTESTINE  Patient location during evaluation: Endoscopy Anesthesia Type: General Level of consciousness: awake and alert Pain management: pain level controlled Vital Signs Assessment: post-procedure vital signs reviewed and stable Respiratory status: spontaneous breathing, nonlabored ventilation, respiratory function stable and patient connected to nasal cannula oxygen Cardiovascular status: blood pressure returned to baseline and stable Postop Assessment: no apparent nausea or vomiting Anesthetic complications: no   There were no known notable events for this encounter.   Last Vitals:  Vitals:   07/09/23 0822 07/09/23 0832  BP: 128/71 (!) 179/73  Pulse: 73 70  Resp: 16 15  Temp:    SpO2: 99% 99%    Last Pain:  Vitals:   07/09/23 0832  TempSrc:   PainSc: 0-No pain                 Rome Ade

## 2023-07-09 NOTE — Op Note (Signed)
 [facility] Gastroenterology Patient Name: Nicole Velez Procedure Date: 07/09/2023 7:41 AM MRN: [id number] Account #: [id number] Date of Birth: September 28, 1950 Admit Type: Outpatient Age: 73 Room: [facility] ENDO ROOM 3 Gender: Female Note Status: Finalized Instrument Name: Arvis [id number] Procedure:             Colonoscopy Indications:           Surveillance: Personal history of adenomatous polyps                         on last colonoscopy 5 years ago Providers:             Ole Schick MD, MD Medicines:             Monitored Anesthesia Care Complications:         No immediate complications. Estimated blood loss:                         Minimal. Procedure:             Pre-Anesthesia Assessment:                        - Prior to the procedure, a History and Physical was                         performed, and patient medications and allergies were                         reviewed. The patient is competent. The risks and                         benefits of the procedure and the sedation options and                         risks were discussed with the patient. All questions                         were answered and informed consent was obtained.                         Patient identification and proposed procedure were                         verified by the physician, the nurse, the                         anesthesiologist, the anesthetist and the technician                         in the endoscopy suite. Mental Status Examination:                         alert and oriented. Airway Examination: normal                         oropharyngeal airway and neck mobility. Respiratory                         Examination: clear to auscultation. CV Examination:  normal. Prophylactic Antibiotics: The patient does not                         require prophylactic antibiotics. Prior                         Anticoagulants: The patient has taken no anticoagulant                          or antiplatelet agents. ASA Grade Assessment: II - A                         patient with mild systemic disease. After reviewing                         the risks and benefits, the patient was deemed in                         satisfactory condition to undergo the procedure. The                         anesthesia plan was to use monitored anesthesia care                         (MAC). Immediately prior to administration of                         medications, the patient was re-assessed for adequacy                         to receive sedatives. The heart rate, respiratory                         rate, oxygen saturations, blood pressure, adequacy of                         pulmonary ventilation, and response to care were                         monitored throughout the procedure. The physical                         status of the patient was re-assessed after the                         procedure.                        After obtaining informed consent, the colonoscope was                         passed under direct vision. Throughout the procedure,                         the patient's blood pressure, pulse, and oxygen                         saturations were monitored continuously. The  Colonoscope was introduced through the anus and                         advanced to the the cecum, identified by appendiceal                         orifice and ileocecal valve. The colonoscopy was                         performed without difficulty. The patient tolerated                         the procedure well. The quality of the bowel                         preparation was good. The ileocecal valve, appendiceal                         orifice, and rectum were photographed. Findings:      The perianal and digital rectal examinations were normal.      A 1 mm polyp was found in the cecum. The polyp was sessile. The polyp       was removed with a jumbo  cold forceps. Resection and retrieval were       complete. Estimated blood loss was minimal.      A few small-mouthed diverticula were found in the sigmoid colon.      Internal hemorrhoids were found during retroflexion. The hemorrhoids       were Grade I (internal hemorrhoids that do not prolapse).      The exam was otherwise without abnormality on direct and retroflexion       views. Impression:            - One 1 mm polyp in the cecum, removed with a jumbo                         cold forceps. Resected and retrieved.                        - Diverticulosis in the sigmoid colon.                        - Internal hemorrhoids.                        - The examination was otherwise normal on direct and                         retroflexion views. Recommendation:        - Discharge patient to home.                        - Resume previous diet.                        - Continue present medications.                        - Await pathology results.                        -  Repeat colonoscopy in 7-10 years for surveillance.                        - Return to referring physician as previously                         scheduled. Procedure Code(s):     --- Professional ---                        [phone], Colonoscopy, flexible; with biopsy, single or                         multiple Diagnosis Code(s):     --- Professional ---                        Z86.010, Personal history of colonic polyps                        D12.0, Benign neoplasm of cecum                        K64.0, First degree hemorrhoids                        K57.30, Diverticulosis of large intestine without                         perforation or abscess without bleeding CPT copyright 2022 American Medical Association. All rights reserved. The codes documented in this report are preliminary and upon coder review may  be revised to meet current compliance requirements. Ole Schick MD, MD 07/09/2023 8:16:48 AM Number of Addenda:  0 Note Initiated On: 07/09/2023 7:41 AM Scope Withdrawal Time: 0 hours 8 minutes 10 seconds  Total Procedure Duration: 0 hours 16 minutes 42 seconds  Estimated Blood Loss:  Estimated blood loss was minimal.      [facility]

## 2023-07-09 NOTE — H&P (Signed)
 Outpatient short stay form Pre-procedure 07/09/2023  [name], MD  Primary Physician: Alla Amis, MD  Reason for visit:  Surveillance  History of present illness:    73 y/o lady with history of arthritis, GERD, and osteopenia here for colonoscopy due to history of polyps. Last colonoscopy in 2020 with adenomatous polyps. No blood thinners. No family history of GI malignancies. History of cholecystectomy and hysterectomy.    Current Facility-Administered Medications:    0.9 %  sodium chloride  infusion, , Intravenous, Continuous, Arbadella Kimbler, Ole ONEIDA, MD, Last Rate: 20 mL/hr at 07/09/23 0727, New Bag at 07/09/23 0727  Medications Prior to Admission  Medication Sig Dispense Refill Last Dose/Taking   GEMTESA  75 MG TABS TAKE 1 TABLET(75 MG) BY MOUTH DAILY 90 tablet 3 Past Week   Multiple Vitamin (MULTI-VITAMINS) TABS Take 1 tablet by mouth daily.    Past Week   PREMARIN 0.625 MG tablet Take 0.625 mg by mouth daily.   3 Past Week   RABEprazole (ACIPHEX) 20 MG tablet Take 1 tablet by mouth daily.  3 Past Week     No Known Allergies   Past Medical History:  Diagnosis Date   Barrett's esophagus without dysplasia    Chickenpox    Chronic kidney disease    Chronic lumbar radiculopathy    GERD (gastroesophageal reflux disease)    Helicobacter pylori gastritis    Hiatal hernia    History of nephrolithiasis    History of tension headache    Migraines    Osteopenia    Overactive bladder    Primary insomnia     Review of systems:  Otherwise negative.    Physical Exam  Gen: Alert, oriented. Appears stated age.  HEENT: PERRLA. Lungs: No respiratory distress CV: RRR Abd: soft, benign, no masses Ext: No edema    Planned procedures: Proceed with colonoscopy. The patient understands the nature of the planned procedure, indications, risks, alternatives and potential complications including but not limited to bleeding, infection, perforation, damage to internal  organs and possible oversedation/side effects from anesthesia. The patient agrees and gives consent to proceed.  Please refer to procedure notes for findings, recommendations and patient disposition/instructions.     [name], MD [facility] Gastroenterology

## 2023-07-09 NOTE — Anesthesia Preprocedure Evaluation (Signed)
 Anesthesia Evaluation  Patient identified by MRN, date of birth, ID band Patient awake    Reviewed: Allergy & Precautions, NPO status , Patient's Chart, lab work & pertinent test results  History of Anesthesia Complications Negative for: history of anesthetic complications  Airway Mallampati: II  TM Distance: >3 FB Neck ROM: Full    Dental  (+) Chipped   Pulmonary neg pulmonary ROS, neg sleep apnea, neg COPD, Patient abstained from smoking.Not current smoker   Pulmonary exam normal breath sounds clear to auscultation       Cardiovascular Exercise Tolerance: Good METS(-) hypertension(-) CAD and (-) Past MI negative cardio ROS Normal cardiovascular exam(-) dysrhythmias  Rhythm:Regular Rate:Normal     Neuro/Psych  Headaches  negative psych ROS   GI/Hepatic Neg liver ROS, hiatal hernia,GERD  ,,  Endo/Other  negative endocrine ROSneg diabetes    Renal/GU CRFRenal disease (CKD)  negative genitourinary   Musculoskeletal   Abdominal   Peds  Hematology negative hematology ROS (+)   Anesthesia Other Findings Chronic kidney disease  Hiatal hernia GERD (gastroesophageal reflux disease) Helicobacter pylori gastritis  Migraines Osteopenia  Barrett's esophagus without dysplasia History of tension headache  History of nephrolithiasis      Reproductive/Obstetrics negative OB ROS                             Anesthesia Physical Anesthesia Plan  ASA: 2  Anesthesia Plan: General   Post-op Pain Management: Minimal or no pain anticipated   Induction: Intravenous  PONV Risk Score and Plan: 3 and Propofol  infusion, TIVA and Ondansetron   Airway Management Planned: Nasal Cannula  Additional Equipment: None  Intra-op Plan:   Post-operative Plan: Extubation in OR  Informed Consent: I have reviewed the patients History and Physical, chart, labs and discussed the procedure including the risks,  benefits and alternatives for the proposed anesthesia with the patient or authorized representative who has indicated his/her understanding and acceptance.     Dental advisory given  Plan Discussed with: CRNA and Surgeon  Anesthesia Plan Comments: (Discussed risks of anesthesia with patient, including possibility of difficulty with spontaneous ventilation under anesthesia necessitating airway intervention, PONV, and rare risks such as cardiac or respiratory or neurological events, and allergic reactions. Discussed the role of CRNA in patient's perioperative care. Patient understands.)       Anesthesia Quick Evaluation

## 2023-07-09 NOTE — Transfer of Care (Signed)
 Immediate Anesthesia Transfer of Care Note  Patient: Nicole Velez  Procedure(s) Performed: COLONOSCOPY POLYPECTOMY, INTESTINE  Patient Location: PACU  Anesthesia Type:General  Level of Consciousness: drowsy  Airway & Oxygen Therapy: Patient Spontanous Breathing  Post-op Assessment: Report given to RN and Post -op Vital signs reviewed and stable  Post vital signs: stable  Last Vitals:  Vitals Value Taken Time  BP 130/66 07/09/23 08:13  Temp    Pulse 80 07/09/23 08:13  Resp 18 07/09/23 08:13  SpO2 99 % 07/09/23 08:13  Vitals shown include unfiled device data.  Last Pain:  Vitals:   07/09/23 0713  TempSrc: Temporal  PainSc: 0-No pain         Complications: No notable events documented.

## 2023-07-10 LAB — SURGICAL PATHOLOGY

## 2023-08-16 ENCOUNTER — Other Ambulatory Visit: Payer: Self-pay | Admitting: Obstetrics and Gynecology

## 2023-08-16 DIAGNOSIS — Z1231 Encounter for screening mammogram for malignant neoplasm of breast: Secondary | ICD-10-CM

## 2023-09-11 ENCOUNTER — Ambulatory Visit
Admission: RE | Admit: 2023-09-11 | Discharge: 2023-09-11 | Disposition: A | Discharge status: Home / Self Care | Source: Ambulatory Visit | Attending: Obstetrics and Gynecology | Admitting: Obstetrics and Gynecology

## 2023-09-11 DIAGNOSIS — Z1231 Encounter for screening mammogram for malignant neoplasm of breast: Secondary | ICD-10-CM | POA: Insufficient documentation

## 2024-02-07 ENCOUNTER — Other Ambulatory Visit: Payer: Self-pay

## 2024-02-07 ENCOUNTER — Emergency Department
Admission: EM | Admit: 2024-02-07 | Discharge: 2024-02-07 | Disposition: A | Discharge status: Home / Self Care | Source: Ambulatory Visit | Attending: Emergency Medicine | Admitting: Emergency Medicine

## 2024-02-07 ENCOUNTER — Emergency Department

## 2024-02-07 DIAGNOSIS — R519 Headache, unspecified: Secondary | ICD-10-CM

## 2024-02-07 DIAGNOSIS — I10 Essential (primary) hypertension: Secondary | ICD-10-CM | POA: Diagnosis not present

## 2024-02-07 LAB — CBC WITH DIFFERENTIAL/PLATELET
Abs Immature Granulocytes: 0.02 K/uL (ref 0.00–0.07)
Basophils Absolute: 0.1 K/uL (ref 0.0–0.1)
Basophils Relative: 1 %
Eosinophils Absolute: 0.2 K/uL (ref 0.0–0.5)
Eosinophils Relative: 2 %
HCT: 41.2 % (ref 36.0–46.0)
Hemoglobin: 13.9 g/dL (ref 12.0–15.0)
Immature Granulocytes: 0 %
Lymphocytes Relative: 35 %
Lymphs Abs: 2.8 K/uL (ref 0.7–4.0)
MCH: 30.1 pg (ref 26.0–34.0)
MCHC: 33.7 g/dL (ref 30.0–36.0)
MCV: 89.2 fL (ref 80.0–100.0)
Monocytes Absolute: 0.5 K/uL (ref 0.1–1.0)
Monocytes Relative: 7 %
Neutro Abs: 4.3 K/uL (ref 1.7–7.7)
Neutrophils Relative %: 55 %
Platelets: 221 K/uL (ref 150–400)
RBC: 4.62 MIL/uL (ref 3.87–5.11)
RDW: 13.2 % (ref 11.5–15.5)
WBC: 7.8 K/uL (ref 4.0–10.5)
nRBC: 0 % (ref 0.0–0.2)

## 2024-02-07 LAB — BASIC METABOLIC PANEL WITH GFR
Anion gap: 11 (ref 5–15)
BUN: 14 mg/dL (ref 8–23)
CO2: 25 mmol/L (ref 22–32)
Calcium: 9.6 mg/dL (ref 8.9–10.3)
Chloride: 102 mmol/L (ref 98–111)
Creatinine, Ser: 0.74 mg/dL (ref 0.44–1.00)
GFR, Estimated: >60 mL/min
Glucose, Bld: 95 mg/dL (ref 70–99)
Potassium: 4.3 mmol/L (ref 3.5–5.1)
Sodium: 138 mmol/L (ref 135–145)

## 2024-02-07 MED ORDER — SODIUM CHLORIDE 0.9 % IV BOLUS
500.0000 mL | Freq: Once | INTRAVENOUS | Status: AC
  Administered 2024-02-07: 500 mL via INTRAVENOUS

## 2024-02-07 MED ORDER — KETOROLAC TROMETHAMINE 15 MG/ML IJ SOLN
15.0000 mg | Freq: Once | INTRAMUSCULAR | Status: AC
  Administered 2024-02-07: 15 mg via INTRAVENOUS
  Filled 2024-02-07: qty 1

## 2024-02-07 MED ORDER — METOCLOPRAMIDE HCL 5 MG/ML IJ SOLN
10.0000 mg | Freq: Once | INTRAMUSCULAR | Status: AC
  Administered 2024-02-07: 10 mg via INTRAVENOUS
  Filled 2024-02-07: qty 2

## 2024-02-07 MED ORDER — DIPHENHYDRAMINE HCL 50 MG/ML IJ SOLN
12.5000 mg | Freq: Once | INTRAMUSCULAR | Status: AC
  Administered 2024-02-07: 12.5 mg via INTRAVENOUS
  Filled 2024-02-07: qty 1

## 2024-02-07 NOTE — ED Provider Notes (Signed)
 "  [facility] Provider Note    Event Date/Time   First MD Initiated Contact with Patient 02/07/24 1313     (approximate)   History   Hypertension   HPI  Nicole Velez is a 74 y.o. female with no reported history of high blood pressure who was sent for evaluation from PCPs office because of elevated blood pressure and headache for the last several days.  She denies chest pain.  No neurodeficits reported no changes in vision.  Does report a history of headaches in the past this has been longer lasting     Physical Exam   Triage Vital Signs: ED Triage Vitals [02/07/24 1243]  Encounter Vitals Group     BP (!) 209/80     Girls Systolic BP Percentile      Girls Diastolic BP Percentile      Boys Systolic BP Percentile      Boys Diastolic BP Percentile      Pulse Rate 72     Resp 15     Temp 97.7 F (36.5 C)     Temp Source Oral     SpO2 96 %     Weight 68.9 kg (152 lb)     Height 1.575 m (5' 2)     Head Circumference      Peak Flow      Pain Score 9     Pain Loc      Pain Education      Exclude from Growth Chart     Most recent vital signs: Vitals:   02/07/24 1243 02/07/24 1529  BP: (!) 209/80 (!) 141/58  Pulse: 72   Resp: 15   Temp: 97.7 F (36.5 C)   SpO2: 96%      General: Awake, no distress.  CV:  Good peripheral perfusion.  Resp:  Normal effort.  Abd:  No distention.  Other:  Cranial nerves II through XII are normal, normal strength in all extremities, PERRLA, EOMI   ED Results / Procedures / Treatments   Labs (all labs ordered are listed, but only abnormal results are displayed) Labs Reviewed  CBC WITH DIFFERENTIAL/PLATELET  BASIC METABOLIC PANEL WITH GFR     EKG     RADIOLOGY CT head viewed and interpreted by me, no acute abnormality, confirmed by radiology    PROCEDURES:  Critical Care performed:   Procedures   MEDICATIONS ORDERED IN ED: Medications  ketorolac  (TORADOL ) 15 MG/ML injection 15 mg (15  mg Intravenous Given 02/07/24 1409)  diphenhydrAMINE  (BENADRYL ) injection 12.5 mg (12.5 mg Intravenous Given 02/07/24 1409)  metoCLOPramide  (REGLAN ) injection 10 mg (10 mg Intravenous Given 02/07/24 1409)  sodium chloride  0.9 % bolus 500 mL (500 mLs Intravenous New Bag/Given 02/07/24 1408)     IMPRESSION / MDM / ASSESSMENT AND PLAN / ED COURSE  I reviewed the triage vital signs and the nursing notes. Patient's presentation is most consistent with acute presentation with potential threat to life or bodily function.  Patient presents with elevated blood pressure and headache as detailed above, differential includes migraine, hypertensive urgency, nonspecific headache, ICH  Will send for CT head  CT head is reassuring  Patient treated with low-dose IV Toradol , Reglan  and Benadryl  with IV fluids  Afterwards she felt much relieved, blood pressure has improved drastically to 140s over 60.  No indication for admission or further workup at this time, will refer to PCP for further evaluation blood pressure management as needed  FINAL CLINICAL IMPRESSION(S) / ED DIAGNOSES   Final diagnoses:  Hypertension, unspecified type  Acute nonintractable headache, unspecified headache type     Rx / DC Orders   ED Discharge Orders     None        Note:  This document was prepared using Dragon voice recognition software and may include unintentional dictation errors.   Arlander Charleston, MD 02/07/24 1533  "

## 2024-02-07 NOTE — ED Triage Notes (Signed)
 First nurse note: pt to ED from [facility] for hypertensive urgency with no hx of HTN. Reports h/a and tingling to bilateral upper body.

## 2024-02-07 NOTE — Progress Notes (Signed)
 Chief Complaint  Patient presents with   Hypertension    X4 days with headaches, slightly dizzy yesterday, but not today     HPI  74 year old female here for acutely elevated blood pressure without history of hypertension  Elevated blood pressure: Patient was evaluated by home health nurse from her insurance company and was noted to have a blood pressure of 208/90.  She has no prior history of hypertension.  Denies any new medications or caffeine intake today.  Denies any acute stressors.  Only complaint is headache and now experiencing some tingling of the upper body including the arms and lips.  ROS Review of systems is unremarkable for any active cardiac, respiratory, GI, GU, hematologic, neurologic, dermatologic, HEENT, or psychiatric symptoms except as noted above.  No fevers, chills, or constitutional symptoms.   Current Outpatient Medications  Medication Sig Dispense Refill   acetaminophen  (TYLENOL ) 500 MG tablet Take 1,000 mg by mouth every 8 (eight) hours as needed for Pain     calcium carbonate-vitamin D3 (CALTRATE 600+D) 600 mg(1,500mg ) -400 unit tablet Take 1 tablet by mouth once daily     estrogens, conjugated, (PREMARIN) 0.625 MG tablet Take 1 tablet (0.625 mg total) by mouth once daily 90 tablet 0   RABEprazole (ACIPHEX) 20 mg EC tablet TAKE 1 TABLET(20 MG) BY MOUTH DAILY 90 tablet 3   vibegron  (GEMTESA ) 75 mg Tab Take by mouth once daily     No current facility-administered medications for this visit.    Allergies as of 02/07/2024   (No Known Allergies)    Patient Active Problem List  Diagnosis   Gastroesophageal reflux disease without esophagitis   Medicare annual wellness visit, initial - 03/19/18   Osteopenia of lumbar spine (Dexa 08/21/22 - repeat 2 yrs)   Medicare annual wellness visit, subsequent 08/27/23   Chronic lumbar radiculopathy - previously followed by Dr. Avanell   Primary insomnia   Overactive bladder   Hypertriglyceridemia (Trig 303 -  08/22/23) - diet controlled    Past Medical History:  Diagnosis Date   Chickenpox    Chronic kidney disease    kidney stones   Fundic gland polyps of stomach, benign 08/18/2015   GERD (gastroesophageal reflux disease)    H/O vitamin D deficiency    Helicobacter pylori gastritis 2007   Hiatal hernia    History of Papanicolaou smear of cervix 07/01/2013   Negative   Migraines    Reflux esophagitis 08/18/2015   Vitamin D deficiency 07/2016    Past Surgical History:  Procedure Laterality Date   COLONOSCOPY  01/23/2008   CHOLECYSTECTOMY  05/06/2014   EGD  08/18/2015   Fundic gland polyps/Reflux esophagitis/Chronic gastritis/No Repeat/MUS   COLONOSCOPY  06/05/2018   Tubular adenoma of the colon/Hyperplastic colon polyp/Repeat 9yrs/MUS   EGD  06/05/2018   Hyperplastic stomach polyp/GERD/Repeat 45yrs/MUS   Irrigation and debridement with removal of retained foreign body, right index fingertip. Right 12/05/2021   Dr.Poggi   EGD @ [facility]  07/31/2022   Normal examined EGD/Repeat PRN/CTL   Colon @ [facility]  07/09/2023   Small TA on colonoscopy. Repeat colonoscopy in 7 years if benefits outweigh risks/CTL   COLONOSCOPY     HYSTERECTOMY     Tear duct surgery right eye x2      TONSILLECTOMY     TUBAL LIGATION     UPPER GASTROINTESTINAL ENDOSCOPY     wisdom teeth extraction     top two    Vitals:   02/07/24 1204 02/07/24 1208  BP: ROLLEN)  224/92 (!) 206/92  Pulse: 77   SpO2: 98%   Weight: 68.9 kg (152 lb)   Height: 158.8 cm (5' 2.5)   PainSc:   9   PainLoc: Other (Comment)    Body mass index is 27.36 kg/m.  Exam  General. Well appearing; NAD; VS reviewed     Eyes. Sclera and conjunctiva clear; Vision grossly intact; extraocular movements intact Neck. Supple.  Lungs. Respirations unlabored; clear to auscultation bilaterally Cardiovascular. Heart regular rate and rhythm without murmurs, gallops, or rubs Skin. Normal color and turgor Neurologic. Alert and  oriented x3; CN 2-12 grossly intact; no focal deficits  Assessment and plan 1. Hypertensive urgency Acute.  No prior history of hypertension.  Patient has neurological symptoms including tingling sensation and headache.  Patient needs immediate evaluation via emergency room.  Will escort her to the emergency room and they can observe and lower her blood pressure accordingly.  F/u- pt being transported to [facility] ER ALDA CARPEN, MD

## 2024-02-07 NOTE — ED Triage Notes (Addendum)
 Pt sent from [facility] for HTN. Pt c/o headache and mild tingling for a few days. Pt denies any new visual changes. Pt denies CP/SOB.

## 2024-02-07 NOTE — Discharge Instructions (Signed)
 Your workup today was reassuring, after treatment your blood pressure improved significantly, your CT scan was unremarkable.  I would recommend further evaluation with your PCP to ensure your blood pressure is controlled and to make sure no further diagnostics are necessary
